# Patient Record
Sex: Female | Born: 1945 | Race: Black or African American | Hispanic: No | Marital: Married | Smoking: Former smoker
Health system: Southern US, Community
[De-identification: ages and names within clinical notes are randomized; demographics above are authoritative.]

## PROBLEM LIST (undated history)

## (undated) DIAGNOSIS — I10 Essential (primary) hypertension: Secondary | ICD-10-CM

## (undated) DIAGNOSIS — Z992 Dependence on renal dialysis: Secondary | ICD-10-CM

## (undated) DIAGNOSIS — E538 Deficiency of other specified B group vitamins: Secondary | ICD-10-CM

## (undated) DIAGNOSIS — M109 Gout, unspecified: Secondary | ICD-10-CM

## (undated) DIAGNOSIS — E119 Type 2 diabetes mellitus without complications: Secondary | ICD-10-CM

## (undated) DIAGNOSIS — N289 Disorder of kidney and ureter, unspecified: Secondary | ICD-10-CM

## (undated) DIAGNOSIS — N186 End stage renal disease: Secondary | ICD-10-CM

## (undated) DIAGNOSIS — D649 Anemia, unspecified: Secondary | ICD-10-CM

## (undated) DIAGNOSIS — M1990 Unspecified osteoarthritis, unspecified site: Secondary | ICD-10-CM

## (undated) DIAGNOSIS — N2581 Secondary hyperparathyroidism of renal origin: Secondary | ICD-10-CM

## (undated) HISTORY — PX: ABDOMINAL HYSTERECTOMY: SHX81

---

## 2004-12-15 ENCOUNTER — Ambulatory Visit: Payer: Self-pay | Admitting: Internal Medicine

## 2005-01-28 ENCOUNTER — Ambulatory Visit: Payer: Self-pay | Admitting: Family Medicine

## 2006-10-30 ENCOUNTER — Ambulatory Visit: Payer: Self-pay | Admitting: Family Medicine

## 2007-04-01 ENCOUNTER — Ambulatory Visit: Payer: Self-pay | Admitting: Internal Medicine

## 2007-08-15 ENCOUNTER — Emergency Department: Payer: Self-pay | Admitting: Emergency Medicine

## 2008-04-28 ENCOUNTER — Ambulatory Visit: Payer: Self-pay | Admitting: Family Medicine

## 2008-08-25 ENCOUNTER — Ambulatory Visit: Payer: Self-pay | Admitting: Family Medicine

## 2009-02-21 ENCOUNTER — Observation Stay: Payer: Self-pay | Admitting: Internal Medicine

## 2009-05-01 ENCOUNTER — Ambulatory Visit: Payer: Self-pay | Admitting: Family Medicine

## 2009-05-17 ENCOUNTER — Ambulatory Visit: Payer: Self-pay | Admitting: Gastroenterology

## 2010-06-05 ENCOUNTER — Ambulatory Visit: Payer: Self-pay | Admitting: Family Medicine

## 2011-06-11 ENCOUNTER — Ambulatory Visit: Payer: Self-pay | Admitting: Family Medicine

## 2011-06-14 ENCOUNTER — Ambulatory Visit: Payer: Self-pay | Admitting: Family Medicine

## 2012-06-15 ENCOUNTER — Ambulatory Visit: Payer: Self-pay | Admitting: Family Medicine

## 2013-06-16 ENCOUNTER — Ambulatory Visit: Payer: Self-pay | Admitting: Family Medicine

## 2014-06-21 ENCOUNTER — Ambulatory Visit: Payer: Self-pay | Admitting: Family Medicine

## 2015-06-06 ENCOUNTER — Other Ambulatory Visit: Payer: Self-pay | Admitting: Family Medicine

## 2015-06-06 DIAGNOSIS — Z1231 Encounter for screening mammogram for malignant neoplasm of breast: Secondary | ICD-10-CM

## 2015-06-27 ENCOUNTER — Ambulatory Visit
Admission: RE | Admit: 2015-06-27 | Discharge: 2015-06-27 | Disposition: A | Discharge status: Home / Self Care | Payer: Medicare PPO | Source: Ambulatory Visit | Attending: Family Medicine | Admitting: Family Medicine

## 2015-06-27 DIAGNOSIS — Z1231 Encounter for screening mammogram for malignant neoplasm of breast: Secondary | ICD-10-CM | POA: Diagnosis not present

## 2016-01-16 ENCOUNTER — Emergency Department
Admission: EM | Admit: 2016-01-16 | Discharge: 2016-01-16 | Disposition: A | Discharge status: Home / Self Care | Payer: Medicare PPO | Attending: Emergency Medicine | Admitting: Emergency Medicine

## 2016-01-16 ENCOUNTER — Encounter: Payer: Self-pay | Admitting: Emergency Medicine

## 2016-01-16 DIAGNOSIS — M79671 Pain in right foot: Secondary | ICD-10-CM | POA: Diagnosis present

## 2016-01-16 DIAGNOSIS — R609 Edema, unspecified: Secondary | ICD-10-CM

## 2016-01-16 DIAGNOSIS — E119 Type 2 diabetes mellitus without complications: Secondary | ICD-10-CM | POA: Insufficient documentation

## 2016-01-16 DIAGNOSIS — I10 Essential (primary) hypertension: Secondary | ICD-10-CM | POA: Diagnosis not present

## 2016-01-16 DIAGNOSIS — R809 Proteinuria, unspecified: Secondary | ICD-10-CM | POA: Insufficient documentation

## 2016-01-16 DIAGNOSIS — R600 Localized edema: Secondary | ICD-10-CM | POA: Insufficient documentation

## 2016-01-16 HISTORY — DX: Disorder of kidney and ureter, unspecified: N28.9

## 2016-01-16 HISTORY — DX: Gout, unspecified: M10.9

## 2016-01-16 HISTORY — DX: Essential (primary) hypertension: I10

## 2016-01-16 HISTORY — DX: Type 2 diabetes mellitus without complications: E11.9

## 2016-01-16 LAB — CBC WITH DIFFERENTIAL/PLATELET
Basophils Absolute: 0.1 10*3/uL (ref 0–0.1)
Basophils Relative: 1 %
EOS ABS: 0.2 10*3/uL (ref 0–0.7)
EOS PCT: 3 %
HCT: 24.1 % — ABNORMAL LOW (ref 35.0–47.0)
Hemoglobin: 8 g/dL — ABNORMAL LOW (ref 12.0–16.0)
LYMPHS ABS: 1.7 10*3/uL (ref 1.0–3.6)
Lymphocytes Relative: 18 %
MCH: 26.4 pg (ref 26.0–34.0)
MCHC: 33 g/dL (ref 32.0–36.0)
MCV: 80 fL (ref 80.0–100.0)
MONOS PCT: 8 %
Monocytes Absolute: 0.7 10*3/uL (ref 0.2–0.9)
Neutro Abs: 6.8 10*3/uL — ABNORMAL HIGH (ref 1.4–6.5)
Neutrophils Relative %: 72 %
PLATELETS: 298 10*3/uL (ref 150–440)
RBC: 3.02 MIL/uL — AB (ref 3.80–5.20)
RDW: 15.2 % — AB (ref 11.5–14.5)
WBC: 9.5 10*3/uL (ref 3.6–11.0)

## 2016-01-16 LAB — COMPREHENSIVE METABOLIC PANEL
ALT: 8 U/L — AB (ref 14–54)
AST: 13 U/L — AB (ref 15–41)
Albumin: 3.2 g/dL — ABNORMAL LOW (ref 3.5–5.0)
Alkaline Phosphatase: 67 U/L (ref 38–126)
Anion gap: 7 (ref 5–15)
BUN: 20 mg/dL (ref 6–20)
CHLORIDE: 111 mmol/L (ref 101–111)
CO2: 22 mmol/L (ref 22–32)
CREATININE: 2.45 mg/dL — AB (ref 0.44–1.00)
Calcium: 8.9 mg/dL (ref 8.9–10.3)
GFR calc Af Amer: 22 mL/min — ABNORMAL LOW (ref >60)
GFR, EST NON AFRICAN AMERICAN: 19 mL/min — AB (ref >60)
Glucose, Bld: 92 mg/dL (ref 65–99)
Potassium: 4.3 mmol/L (ref 3.5–5.1)
Sodium: 140 mmol/L (ref 135–145)
Total Bilirubin: <0.1 mg/dL — ABNORMAL LOW (ref 0.3–1.2)
Total Protein: 7.2 g/dL (ref 6.5–8.1)

## 2016-01-16 LAB — URINALYSIS COMPLETE WITH MICROSCOPIC (ARMC ONLY)
BILIRUBIN URINE: NEGATIVE
GLUCOSE, UA: NEGATIVE mg/dL
HGB URINE DIPSTICK: NEGATIVE
Ketones, ur: NEGATIVE mg/dL
Nitrite: NEGATIVE
Protein, ur: >500 mg/dL — AB
RBC / HPF: NONE SEEN RBC/hpf (ref 0–5)
Specific Gravity, Urine: 1.01 (ref 1.005–1.030)
pH: 6 (ref 5.0–8.0)

## 2016-01-16 LAB — URIC ACID: URIC ACID, SERUM: 7.9 mg/dL — AB (ref 2.3–6.6)

## 2016-01-16 MED ORDER — HYDROCHLOROTHIAZIDE 12.5 MG PO TABS: 12.5000 mg | ORAL_TABLET | Freq: Every day | ORAL | Status: AC

## 2016-01-16 MED ORDER — TRAMADOL HCL 50 MG PO TABS: 50.0000 mg | ORAL_TABLET | Freq: Two times a day (BID) | ORAL | Status: DC

## 2016-01-16 NOTE — ED Notes (Signed)
States she has a hx of gout  Having pain to right foot with swelling

## 2016-01-16 NOTE — Discharge Instructions (Signed)
Peripheral Edema You have swelling in your legs (peripheral edema). This swelling is due to excess accumulation of salt and water in your body. Edema may be a sign of heart, kidney or liver disease, or a side effect of a medication. It may also be due to problems in the leg veins. Elevating your legs and using special support stockings may be very helpful, if the cause of the swelling is due to poor venous circulation. Avoid long periods of standing, whatever the cause. Treatment of edema depends on identifying the cause. Chips, pretzels, pickles and other salty foods should be avoided. Restricting salt in your diet is almost always needed. Water pills (diuretics) are often used to remove the excess salt and water from your body via urine. These medicines prevent the kidney from reabsorbing sodium. This increases urine flow. Diuretic treatment may also result in lowering of potassium levels in your body. Potassium supplements may be needed if you have to use diuretics daily. Daily weights can help you keep track of your progress in clearing your edema. You should call your caregiver for follow up care as recommended. SEEK IMMEDIATE MEDICAL CARE IF:   You have increased swelling, pain, redness, or heat in your legs.  You develop shortness of breath, especially when lying down.  You develop chest or abdominal pain, weakness, or fainting.  You have a fever.   This information is not intended to replace advice given to you by your health care provider. Make sure you discuss any questions you have with your health care provider.   Document Released: 08/15/2004 Document Revised: 09/30/2011 Document Reviewed: 01/18/2015 Elsevier Interactive Patient Education 2016 [employer].  Proteinuria Proteinuria is a condition in which urine contains more protein than is normal. Proteinuria is either a sign that your body is producing too much protein or a sign that there is a problem with the kidneys. Healthy  kidneys prevent most substances that the body needs, including proteins, from leaving the bloodstream and ending up in urine. CAUSES  Proteinuria may be caused by a temporary event or condition such as stress, exercise, or fever, and go away on its own. Proteinuria may also be a symptom of a more serious condition or disease. Causes of proteinuria include:  A kidney disease caused by:  Diabetes.  High blood pressure (hypertension).   A disease that affects the immune system, such as lupus.  A genetic disease, such as Alport's syndrome.  Medicines that damage the kidneys, such as long-term nonsteroidal anti-inflammatory drugs (NSAIDs).  Poisoning or exposure to toxic substances.  A reoccurring kidney or urinary infection.  Excess protein production in the body caused by:  Multiple myeloma.  Amyloidosis. SYMPTOMS You may have proteinuria without having noticeable symptoms. If there is a large amount of protein in your urine, your urine may look foamy. You may also notice swelling (edema) in your hands, feet, abdomen, or face. DIAGNOSIS To determine whether you have proteinuria, you will need to provide a urine sample. Your urine will then be tested for too much protein and the main blood protein albumin. If your test shows that you have proteinuria, you may need to take additional tests to determine its cause, how much protein is in your urine, and what type of protein is being lost. Tests may include:  Blood tests.  Urine tests.  A blood pressure measurement.  Imaging tests. TREATMENT  Treatment will depend on the cause of your proteinuria. Your caregiver will discuss treatment options with you after you  have been diagnosed. If your proteinuria is mild or temporary, no treatment may be necessary. HOME CARE INSTRUCTIONS Ask your caregiver if monitoring the level of protein in your urine at home using simple testing strips is appropriate for you. Early detection of proteinuria  can lead to early and often successful treatment of the condition causing it.   This information is not intended to replace advice given to you by your health care provider. Make sure you discuss any questions you have with your health care provider.   Document Released: 08/28/2005 Document Revised: 04/01/2012 Document Reviewed: 12/06/2011 Elsevier Interactive Patient Education 2016 [place] should follow-up with your provider for ongoing treatment of your fluid retention and protein in the urine. Take the prescription meds as directed. Rest with the legs elevated when seated.

## 2016-01-17 NOTE — ED Provider Notes (Signed)
[facility] Emergency Department Provider Note ____________________________________________  Time seen: 65  I have reviewed the triage vital signs and the nursing notes.  HISTORY  Chief Complaint  Foot Pain  HPI Shelby Galloway is a 70 y.o. female presents to the ED accompanied by her adult son, for evaluation of foot pain on the rightand swelling to the legs. The patient reports she was recently evaluated by her primary care provider and treated with about a week's worth of prednisone which she dosed twice daily. The treatment was for an assumed gout flare causing pain to the right foot and leg. She has since completed the prednisone and reported resolution of her pain. Over the last few days however she's noted increased swelling to the right and left lower extremity as well as return of the pain to her right great toe and ankle. She denies any interim injury, fall, or trauma. She has been limited in her ability to ambulate recently due to the swelling, and has had to use her cane to get around. She was unable to sit On with the primary care provider or get a call back today, so she presents here for further evaluation and management. She gives a history of diabetes previously treated with metformin, which was recently discontinued by her primary provider. She has not had a gout attack from the time she began dosing metformin. She has a history of anemia, high blood pressure and gout.  Past Medical History  Diagnosis Date  . Gout   . Hypertension   . Diabetes mellitus without complication ([place])   . Renal disorder     There are no active problems to display for this patient.   Past Surgical History  Procedure Laterality Date  . Abdominal hysterectomy      Current Outpatient Rx  Name  Route  Sig  Dispense  Refill  . hydrochlorothiazide (HYDRODIURIL) 12.5 MG tablet   Oral   Take 1 tablet (12.5 mg total) by mouth daily.   10 tablet   0   . traMADol  (ULTRAM) 50 MG tablet   Oral   Take 1 tablet (50 mg total) by mouth 2 (two) times daily.   10 tablet   0    Allergies Codeine  No family history on file.  Social History Social History  Substance Use Topics  . Smoking status: Never Smoker   . Smokeless tobacco: None  . Alcohol Use: No   Review of Systems  Constitutional: Negative for fever. Cardiovascular: Negative for chest pain. Swelling to the legs as above.  Respiratory: Negative for shortness of breath. Gastrointestinal: Negative for abdominal pain, vomiting and diarrhea. Genitourinary: Negative for dysuria, retention, or incontinence. Report urinary frequency Musculoskeletal: Negative for back pain. Right foot pain at the great toe.  Skin: Negative for rash. Neurological: Negative for headaches, focal weakness or numbness. ____________________________________________  PHYSICAL EXAM:  VITAL SIGNS: ED Triage Vitals  Enc Vitals Group     BP 01/16/16 1833 160/69 mmHg     Pulse Rate 01/16/16 1833 78     Resp 01/16/16 1833 18     Temp 01/16/16 1833 100.3 F (37.9 C)     Temp Source 01/16/16 1833 Oral     SpO2 01/16/16 1833 100 %     Weight 01/16/16 1833 169 lb (76.658 kg)     Height 01/16/16 1833 5\' 6"  (1.676 m)     Head Cir --      Peak Flow --  Pain Score 01/16/16 1838 4     Pain Loc --      Pain Edu? --      Excl. in [place]? --    Constitutional: Alert and oriented. Well appearing and in no distress. Head: Normocephalic and atraumatic. Cardiovascular: Normal rate, regular rhythm. Normal distal pulses and cap refill.  Respiratory: Normal respiratory effort. No wheezes/rales/rhonchi. Gastrointestinal: Soft and nontender. No distention. Musculoskeletal: Right great toe with local erythema and tenderness to the MTP. Ankle edema noted with normal ROM. No calf or achilles tenderness noted. Nontender with normal range of motion in all extremities.  Neurologic:  Normal gait without ataxia. Normal speech and  language. No gross focal neurologic deficits are appreciated. Skin:  Skin is warm, dry and intact. No rash noted. 2+ pitting edema bilaterally LE from the feet to the proximal shins. No skin weeping, erythema, or induration.  Psychiatric: Mood and affect are normal. Patient exhibits appropriate insight and judgment. ____________________________________________   LABS (pertinent positives/negatives) Labs Reviewed  URINALYSIS COMPLETEWITH MICROSCOPIC ([facility] ONLY) - Abnormal; Notable for the following:    Color, Urine YELLOW (*)    APPearance HAZY (*)    Protein, ur >500 (*)    Leukocytes, UA TRACE (*)    Bacteria, UA MANY (*)    Squamous Epithelial / LPF 0-5 (*)    All other components within normal limits  COMPREHENSIVE METABOLIC PANEL - Abnormal; Notable for the following:    Creatinine, Ser 2.45 (*)    Albumin 3.2 (*)    AST 13 (*)    ALT 8 (*)    Total Bilirubin <0.1 (*)    GFR calc non Af Amer 19 (*)    GFR calc Af Amer 22 (*)    All other components within normal limits  CBC WITH DIFFERENTIAL/PLATELET - Abnormal; Notable for the following:    RBC 3.02 (*)    Hemoglobin 8.0 (*)    HCT 24.1 (*)    RDW 15.2 (*)    Neutro Abs 6.8 (*)    All other components within normal limits  URIC ACID - Abnormal; Notable for the following:    Uric Acid, Serum 7.9 (*)    All other components within normal limits  ____________________________________________  INITIAL IMPRESSION / ASSESSMENT AND PLAN / ED COURSE  Patient with peripheral edema and proteinuria on exam. She also has an elevated uric acid consistent with her history of gout. She is advised to follow with the primary care provider ahead of her referral to nephrology. She is to rest the legs elevated and take the prescribed Ultram as needed for pain. She is also given low-dose HCTZ to help resolve some of her lower extremity edema. She is to return to the ED for any worsening symptoms as  discussed. ____________________________________________  FINAL CLINICAL IMPRESSION(S) / ED DIAGNOSES  Final diagnoses:  Peripheral edema  Proteinuria     Melvenia Needles, PA-C 01/17/16 0031  Lisa Roca, MD 01/17/16 1610

## 2016-06-04 ENCOUNTER — Other Ambulatory Visit: Payer: Self-pay | Admitting: Family Medicine

## 2016-06-04 DIAGNOSIS — Z1231 Encounter for screening mammogram for malignant neoplasm of breast: Secondary | ICD-10-CM

## 2016-06-27 ENCOUNTER — Ambulatory Visit
Admission: RE | Admit: 2016-06-27 | Discharge: 2016-06-27 | Disposition: A | Discharge status: Home / Self Care | Payer: Medicare PPO | Source: Ambulatory Visit | Attending: Family Medicine | Admitting: Family Medicine

## 2016-06-27 DIAGNOSIS — Z1231 Encounter for screening mammogram for malignant neoplasm of breast: Secondary | ICD-10-CM | POA: Insufficient documentation

## 2016-10-30 ENCOUNTER — Other Ambulatory Visit: Payer: Self-pay | Admitting: Family Medicine

## 2016-10-30 DIAGNOSIS — N6322 Unspecified lump in the left breast, upper inner quadrant: Secondary | ICD-10-CM

## 2016-11-27 ENCOUNTER — Ambulatory Visit
Admission: RE | Admit: 2016-11-27 | Discharge: 2016-11-27 | Disposition: A | Discharge status: Home / Self Care | Payer: Medicare PPO | Source: Ambulatory Visit | Attending: Family Medicine | Admitting: Family Medicine

## 2016-11-27 DIAGNOSIS — N6322 Unspecified lump in the left breast, upper inner quadrant: Secondary | ICD-10-CM | POA: Diagnosis not present

## 2017-04-15 DIAGNOSIS — E1122 Type 2 diabetes mellitus with diabetic chronic kidney disease: Secondary | ICD-10-CM | POA: Insufficient documentation

## 2017-04-15 DIAGNOSIS — M1711 Unilateral primary osteoarthritis, right knee: Secondary | ICD-10-CM | POA: Insufficient documentation

## 2017-06-24 ENCOUNTER — Other Ambulatory Visit: Payer: Self-pay | Admitting: Family Medicine

## 2017-06-24 DIAGNOSIS — R928 Other abnormal and inconclusive findings on diagnostic imaging of breast: Secondary | ICD-10-CM

## 2017-07-01 ENCOUNTER — Other Ambulatory Visit: Payer: Self-pay | Admitting: Family Medicine

## 2017-07-01 DIAGNOSIS — R928 Other abnormal and inconclusive findings on diagnostic imaging of breast: Secondary | ICD-10-CM

## 2017-07-24 DIAGNOSIS — D638 Anemia in other chronic diseases classified elsewhere: Secondary | ICD-10-CM | POA: Insufficient documentation

## 2017-07-24 DIAGNOSIS — R002 Palpitations: Secondary | ICD-10-CM | POA: Insufficient documentation

## 2017-07-24 DIAGNOSIS — N184 Chronic kidney disease, stage 4 (severe): Secondary | ICD-10-CM | POA: Insufficient documentation

## 2017-08-19 ENCOUNTER — Ambulatory Visit
Admission: RE | Admit: 2017-08-19 | Discharge: 2017-08-19 | Disposition: A | Discharge status: Home / Self Care | Payer: Medicare PPO | Source: Ambulatory Visit | Attending: Family Medicine | Admitting: Family Medicine

## 2017-08-19 DIAGNOSIS — R928 Other abnormal and inconclusive findings on diagnostic imaging of breast: Secondary | ICD-10-CM | POA: Diagnosis present

## 2017-10-28 ENCOUNTER — Other Ambulatory Visit: Payer: Self-pay | Admitting: Nephrology

## 2017-10-28 DIAGNOSIS — N184 Chronic kidney disease, stage 4 (severe): Secondary | ICD-10-CM

## 2017-11-10 ENCOUNTER — Ambulatory Visit
Admission: RE | Admit: 2017-11-10 | Discharge: 2017-11-10 | Disposition: A | Discharge status: Home / Self Care | Payer: Medicare PPO | Source: Ambulatory Visit | Attending: Nephrology | Admitting: Nephrology

## 2017-11-10 DIAGNOSIS — N184 Chronic kidney disease, stage 4 (severe): Secondary | ICD-10-CM | POA: Insufficient documentation

## 2017-11-10 DIAGNOSIS — N281 Cyst of kidney, acquired: Secondary | ICD-10-CM | POA: Insufficient documentation

## 2017-11-10 DIAGNOSIS — N261 Atrophy of kidney (terminal): Secondary | ICD-10-CM | POA: Insufficient documentation

## 2018-02-23 ENCOUNTER — Other Ambulatory Visit (INDEPENDENT_AMBULATORY_CARE_PROVIDER_SITE_OTHER): Payer: Self-pay | Admitting: Vascular Surgery

## 2018-02-23 DIAGNOSIS — N186 End stage renal disease: Secondary | ICD-10-CM

## 2018-02-24 ENCOUNTER — Encounter (INDEPENDENT_AMBULATORY_CARE_PROVIDER_SITE_OTHER): Payer: Self-pay | Admitting: Vascular Surgery

## 2018-02-24 ENCOUNTER — Encounter (INDEPENDENT_AMBULATORY_CARE_PROVIDER_SITE_OTHER): Payer: Self-pay

## 2018-02-24 ENCOUNTER — Ambulatory Visit (INDEPENDENT_AMBULATORY_CARE_PROVIDER_SITE_OTHER): Payer: Medicare PPO | Admitting: Vascular Surgery

## 2018-02-24 ENCOUNTER — Ambulatory Visit (INDEPENDENT_AMBULATORY_CARE_PROVIDER_SITE_OTHER): Payer: Medicare PPO

## 2018-02-24 VITALS — BP 147/69 | HR 55 | Resp 13 | Ht 65.0 in | Wt 164.0 lb

## 2018-02-24 DIAGNOSIS — N186 End stage renal disease: Secondary | ICD-10-CM | POA: Diagnosis not present

## 2018-02-24 DIAGNOSIS — E119 Type 2 diabetes mellitus without complications: Secondary | ICD-10-CM

## 2018-02-24 DIAGNOSIS — N184 Chronic kidney disease, stage 4 (severe): Secondary | ICD-10-CM

## 2018-02-24 DIAGNOSIS — I10 Essential (primary) hypertension: Secondary | ICD-10-CM | POA: Diagnosis not present

## 2018-02-24 NOTE — Patient Instructions (Signed)
Vascular Access for Hemodialysis A vascular access is a connection between two blood vessels that allows blood to be easily removed from the body and returned to the body during hemodialysis. Hemodialysis is a procedure in which a machine outside of the body filters the blood. There are three types of vascular accesses:  Arteriovenous fistula. This is a connection between an artery and a vein (usually in the arm) that is made by sewing them together. Blood in the artery flows directly into the vein, causing it to get larger over time. This makes it easier for the vein to be used for hemodialysis. An arteriovenous fistula takes 1-6 months to develop after surgery.  Arteriovenous graft. This is a connection between an artery and a vein in the arm that is made with a tube. An arteriovenous graft can be used within 2-3 weeks of surgery.  Venous catheter. This is a thin, flexible tube that is placed in a large vein (usually in the neck, chest, or groin). A venous catheter for hemodialysis contains two tubes that come out of the skin. A venous catheter can be used right away. It is usually used as a temporary access if you need hemodialysis before a fistula or graft has developed. It may also be used as a permanent access if a fistula or graft cannot be created.  Which type of access is best for me? The type of access that is best for you depends on the size and strength of your veins. A fistula is usually the preferred type of access. It can last several years and is less likely than the other types of accesses to become infected or to cause blood clots within a blood vessel (thrombosis). However, a fistula is not an option for everyone. If your veins are not the right size, a graft may be used instead. Grafts require you to have strong veins. If your veins are not strong enough for a graft, a catheter may be used. Catheters are more likely than fistulas and grafts to become infected or to have  thrombosis. Sometimes, only one type of access is an option. Your health care provider will help you determine which type of access is best for you. How is a vascular access used? The way the access is used depends on the type of access:  If the access is a fistula or graft, two needles are inserted through the skin into the access before each hemodialysis session. Blood leaves the body through one of the needles and travels through a tube to the hemodialysis machine (dialyzer). It then flows through another tube and returns to the body through the second needle.  If the access is a catheter, one tube is connected directly to the tube that leads to the dialyzer and the other is connected to a tube that leads away from the dialyzer. Blood leaves the body through one tube and returns to the body through the other.  What kind of problems can occur with vascular accesses?  Blood clots within a blood vessel (thrombosis). Thrombosis can lead to a narrowing of a blood vessel or tube (stenosis). If thrombosis occurs frequently, another access site may be created as a backup.  Infection. These problems are most likely to occur with a venous catheter and least likely to occur with an arteriovenous fistula. How do I care for my vascular access? Wear a medical alert bracelet. This tells health care providers that you are a dialysis patient in the case of an emergency and   allows them to care for your veins appropriately. If you have a graft or fistula:  A "bruit" is a noise that is heard with a stethoscope and a "thrill" is a vibration felt over the graft or fistula. The presence of the bruit and thrill indicates that the access is working. You will be taught to feel for the thrill each day. If this is not felt, the access may be clotted. Call your health care provider.  You may use the arm where your vascular access is located freely after the site heals. Keep the following in mind: ? Avoid pressure on the  arm. ? Avoid lifting heavy objects with the arm. ? Avoid sleeping on the arm. ? Avoid wearing tight-sleeved shirts or jewelry around the graft or fistula.  Do not allow blood pressure monitoring or needle punctures on the side where the graft or fistula is located.  With permission from your health care provider, you may do exercises to help with blood flow through a fistula. These exercises involve squeezing a rubber ball or other soft objects as instructed.  Contact a health care provider if:  Chills develop.  You have an oral temperature above 102 F (38.9 C).  Swelling around the graft or fistula gets worse.  New pain develops.  Pus or other fluid (drainage) is seen at the vascular access site.  Skin redness or red streaking is seen on the skin around, above, or below the vascular access. Get help right away if:  Pain, numbness, or an unusual pale skin color develops in the hand on the side of your fistula.  Dizziness or weakness develops that you have not had before.  The vascular access has bleeding that cannot be easily controlled. This information is not intended to replace advice given to you by your health care provider. Make sure you discuss any questions you have with your health care provider. Document Released: 09/28/2002 Document Revised: 12/14/2015 Document Reviewed: 11/24/2012 Elsevier Interactive Patient Education  2017 Elsevier Inc[employer]  

## 2018-02-24 NOTE — Progress Notes (Signed)
Patient ID: Lee-Anne Flicker, female   DOB: 11/08/1945, 72 y.o.   MRN: [id number]  Chief Complaint  Patient presents with  . New Patient (Initial Visit)    Vein Mapping    HPI Madyn Ivins is a 72 y.o. female.  I am asked to see the patient by DR. Chang for evaluation of dialysis access.  The patient reports no current uremic symptoms that would prompt immediate dialysis.  Her renal insufficiency is likely secondary to hypertension and diabetes.  No significant volume overload, poor appetite, profound itching, or mental status changes.  She has a long history of chronic kidney disease but does not really know how close she is to dialysis.  She is right-hand dominant.  No fevers or chills.  Vein mapping studies were performed today which showed triphasic arterial waveforms throughout both upper extremities.  However, there were no adequate superficial vessels in either upper extremity for fistula creation with both the cephalic and basilic veins being below 2 mm throughout their course.   Past Medical History:  Diagnosis Date  . Diabetes mellitus without complication ([place])   . Gout   . Hypertension   . Renal disorder     Past Surgical History:  Procedure Laterality Date  . ABDOMINAL HYSTERECTOMY      Family History  Problem Relation Age of Onset  . Breast cancer Neg Hx   No bleeding disorders, clotting disorders, autoimmune diseases, or aneurysms  Social History Social History   Tobacco Use  . Smoking status: Never Smoker  . Smokeless tobacco: Never Used  Substance Use Topics  . Alcohol use: No  . Drug use: Not on file    Allergies  Allergen Reactions  . Ace Inhibitors   . Atorvastatin   . Chlorthalidone   . Codeine Other (See Comments)    States she feels "funny " in the head  . Losartan     States worsened gout    Current Outpatient Medications  Medication Sig Dispense Refill  . allopurinol (ZYLOPRIM) 100 MG tablet Take by mouth.    Marland Kitchen amLODipine  (NORVASC) 10 MG tablet Take by mouth.    . Cholecalciferol (VITAMIN D3) 2000 units capsule Take by mouth.    . ferrous sulfate 325 (65 FE) MG tablet Take by mouth.    . fluticasone (FLONASE) 50 MCG/ACT nasal spray 2 sprays by Each Nare route daily.    . furosemide (LASIX) 20 MG tablet     . glipiZIDE (GLUCOTROL XL) 10 MG 24 hr tablet Take by mouth.    . hydrALAZINE (APRESOLINE) 25 MG tablet Take by mouth.    . hydrochlorothiazide (HYDRODIURIL) 12.5 MG tablet Take 1 tablet (12.5 mg total) by mouth daily. 10 tablet 0  . isosorbide mononitrate (IMDUR) 60 MG 24 hr tablet Take by mouth.    . metoprolol succinate (TOPROL-XL) 25 MG 24 hr tablet Take by mouth.    . rosuvastatin (CRESTOR) 20 MG tablet Take by mouth.    . sodium bicarbonate 650 MG tablet Take 650 mg by mouth 2 (two) times daily.    . traMADol (ULTRAM) 50 MG tablet Take 1 tablet (50 mg total) by mouth 2 (two) times daily. (Patient not taking: Reported on 02/24/2018) 10 tablet 0   No current facility-administered medications for this visit.       REVIEW OF SYSTEMS (Negative unless checked)  Constitutional: [] Weight loss  [] Fever  [] Chills Cardiac: [] Chest pain   [] Chest pressure   [] Palpitations   [] Shortness  of breath when laying flat   [] Shortness of breath at rest   [] Shortness of breath with exertion. Vascular:  [] Pain in legs with walking   [] Pain in legs at rest   [] Pain in legs when laying flat   [] Claudication   [] Pain in feet when walking  [] Pain in feet at rest  [] Pain in feet when laying flat   [] History of DVT   [] Phlebitis   [] Swelling in legs   [] Varicose veins   [] Non-healing ulcers Pulmonary:   [] Uses home oxygen   [] Productive cough   [] Hemoptysis   [] Wheeze  [] COPD   [] Asthma Neurologic:  [] Dizziness  [] Blackouts   [] Seizures   [] History of stroke   [] History of TIA  [] Aphasia   [] Temporary blindness   [] Dysphagia   [] Weakness or numbness in arms   [] Weakness or numbness in legs Musculoskeletal:  [x] Arthritis   [] Joint  swelling   [] Joint pain   [] Low back pain Hematologic:  [] Easy bruising  [] Easy bleeding   [] Hypercoagulable state   [x] Anemic  [] Hepatitis Gastrointestinal:  [] Blood in stool   [] Vomiting blood  [] Gastroesophageal reflux/heartburn   [] Abdominal pain Genitourinary:  [x] Chronic kidney disease   [] Difficult urination  [] Frequent urination  [] Burning with urination   [] Hematuria Skin:  [] Rashes   [] Ulcers   [] Wounds Psychological:  [] History of anxiety   []  History of major depression.    Physical Exam BP (!) 147/69 (BP Location: Right Arm, Patient Position: Sitting)   Pulse (!) 55   Resp 13   Ht 5\' 5"  (1.651 m)   Wt 164 lb (74.4 kg)   BMI 27.29 kg/m  Gen:  WD/WN, NAD Head: [place]/AT, No temporalis wasting.  Ear/Nose/Throat: Hearing grossly intact, nares w/o erythema or drainage, oropharynx w/o Erythema/Exudate Eyes: Conjunctiva clear, sclera non-icteric  Neck: trachea midline.   Pulmonary:  Good air movement, respirations not labored, no use of accessory muscles Cardiac: RRR, no JVD Vascular:  Vessel Right Left  Radial Palpable Palpable                                   Gastrointestinal: soft, non-tender/non-distended. Musculoskeletal: M/S 5/5 throughout.  Extremities without ischemic changes.  No deformity or atrophy.  Trace lower extremity edema. Neurologic: Sensation grossly intact in extremities.  Symmetrical.  Speech is fluent. Motor exam as listed above. Psychiatric: Judgment intact, Mood & affect appropriate for pt's clinical situation. Dermatologic: No rashes or ulcers noted.  No cellulitis or open wounds.    Radiology No results found.  Labs No results found for this or any previous visit (from the past 2160 hour(s)).  Assessment/Plan:  Hypertension An underlying cause of her renal failure and blood pressure control important in reducing the progression of atherosclerotic disease. On appropriate oral medications.   Diabetes mellitus without complication  ([place]) An underlying cause of her renal failure and blood glucose control important in reducing the progression of atherosclerotic disease. Also, involved in wound healing. On appropriate medications.   Chronic kidney disease (CKD), stage IV (severe) ([facility]) Vein mapping studies were performed today which showed triphasic arterial waveforms throughout both upper extremities.  However, there were no adequate superficial vessels in either upper extremity for fistula creation with both the cephalic and basilic veins being below 2 mm throughout their course. We have discussed the differences between fistulas and grafts.  We have also discussed why we placed fistulous well ahead of time but generally reserved graft  placement for immediately before dialysis.  I would recommend a left arm AV graft be placed about 1 month before she initiates dialysis.  I will send a note to her nephrologist to try to coordinate that timing.      Leotis Pain 02/24/2018, 9:35 AM   This note was created with Dragon medical transcription system.  Any errors from dictation are unintentional.

## 2018-02-24 NOTE — Assessment & Plan Note (Signed)
An underlying cause of her renal failure and blood glucose control important in reducing the progression of atherosclerotic disease. Also, involved in wound healing. On appropriate medications.  

## 2018-02-24 NOTE — Assessment & Plan Note (Signed)
Vein mapping studies were performed today which showed triphasic arterial waveforms throughout both upper extremities.  However, there were no adequate superficial vessels in either upper extremity for fistula creation with both the cephalic and basilic veins being below 2 mm throughout their course. We have discussed the differences between fistulas and grafts.  We have also discussed why we placed fistulous well ahead of time but generally reserved graft placement for immediately before dialysis.  I would recommend a left arm AV graft be placed about 1 month before she initiates dialysis.  I will send a note to her nephrologist to try to coordinate that timing.

## 2018-02-24 NOTE — Assessment & Plan Note (Signed)
An underlying cause of her renal failure and blood pressure control important in reducing the progression of atherosclerotic disease. On appropriate oral medications.  

## 2018-07-22 IMAGING — US US RENAL
1 series · 14 of 25 positions shown · non-contrast
Comparison: 08/15/2007 unenhanced CT.

CLINICAL DATA: 71-year-old female chronic kidney disease stage 4.
Initial encounter.

EXAM:
RENAL / URINARY TRACT ULTRASOUND COMPLETE

[Series 1: us renal · 0.22mm/px · 70 acquisitions, 14 frames shown]
[im 1/70]
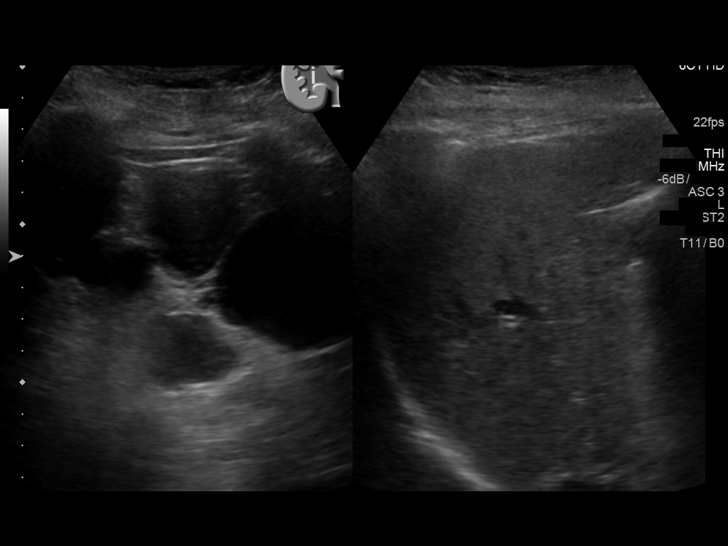
[im 6/70]
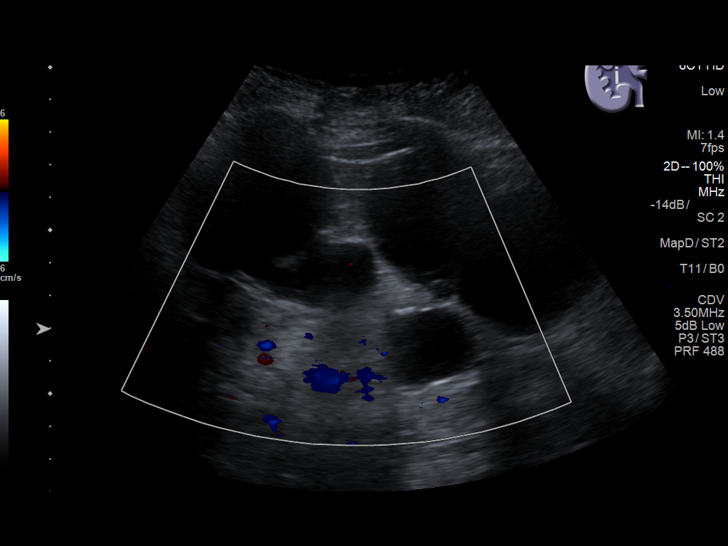
[im 12/70]
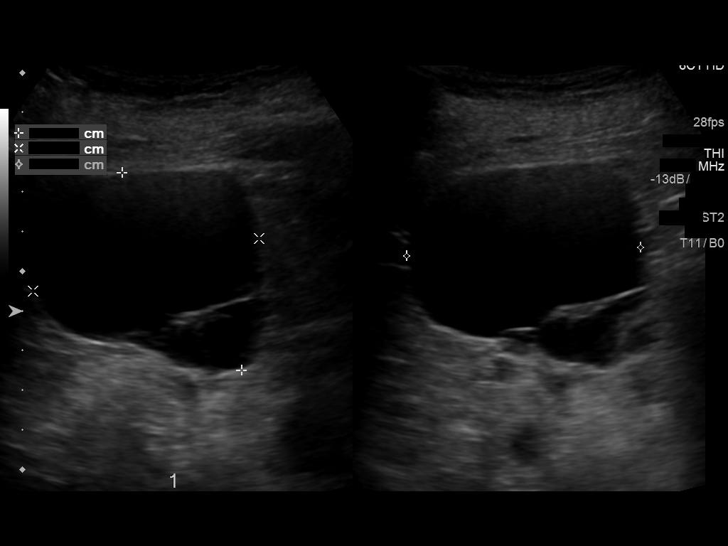
[im 18/70]
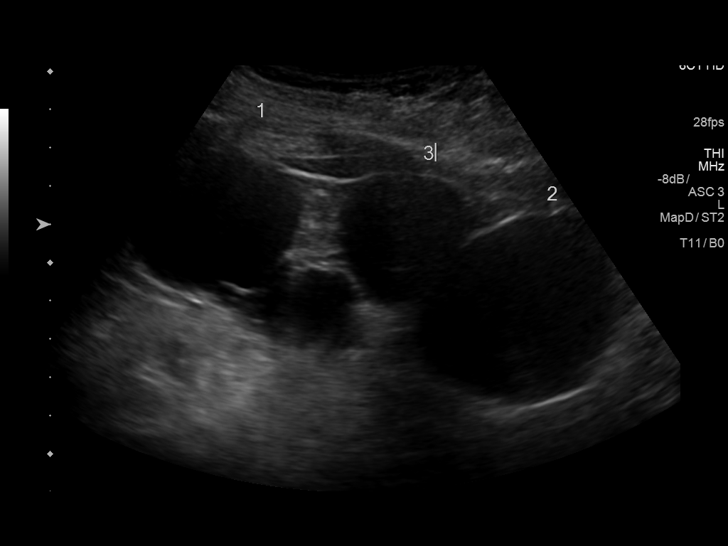
[im 24/70]
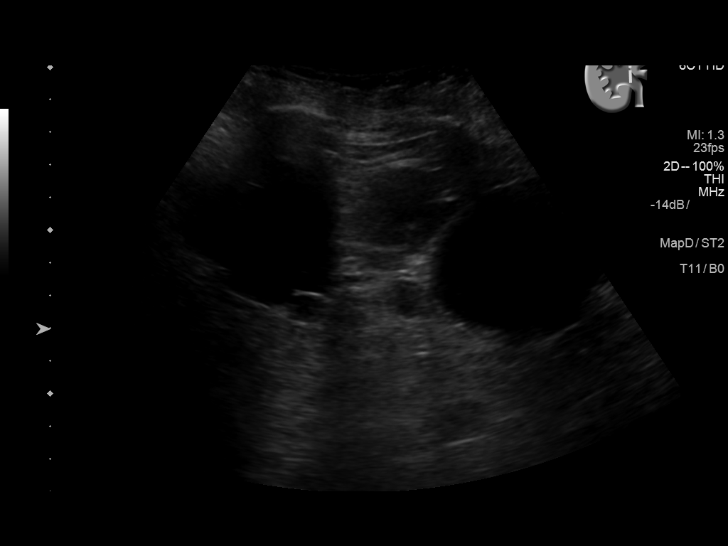
[im 26/70]
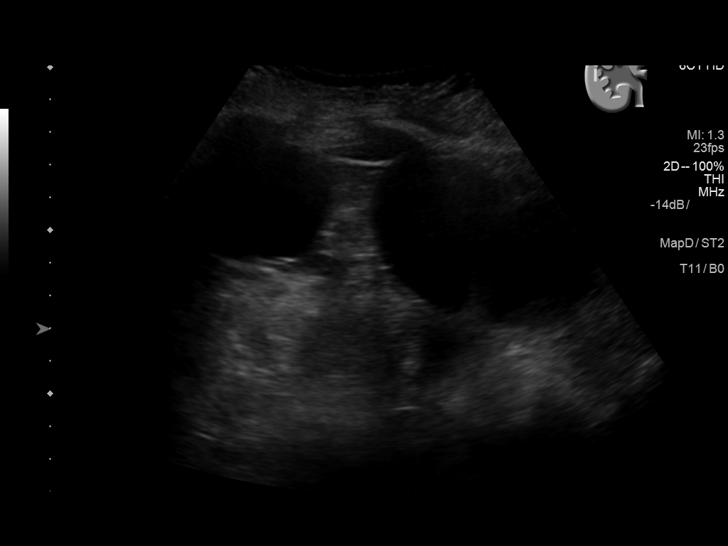
[im 32/70]
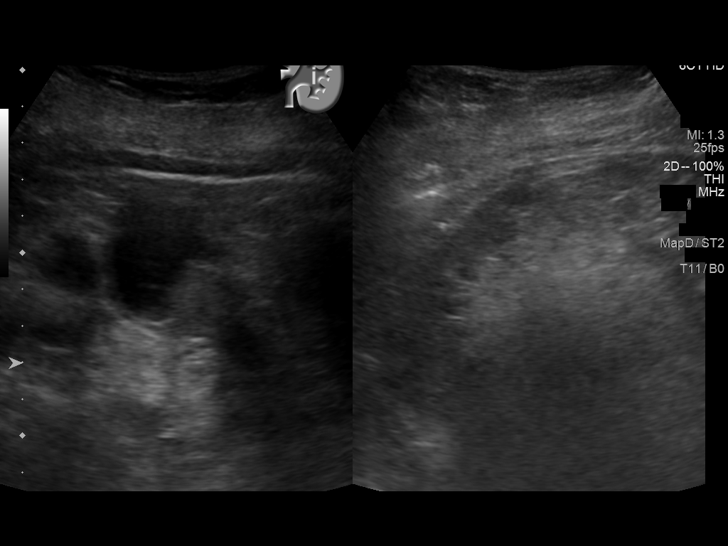
[im 38/70]
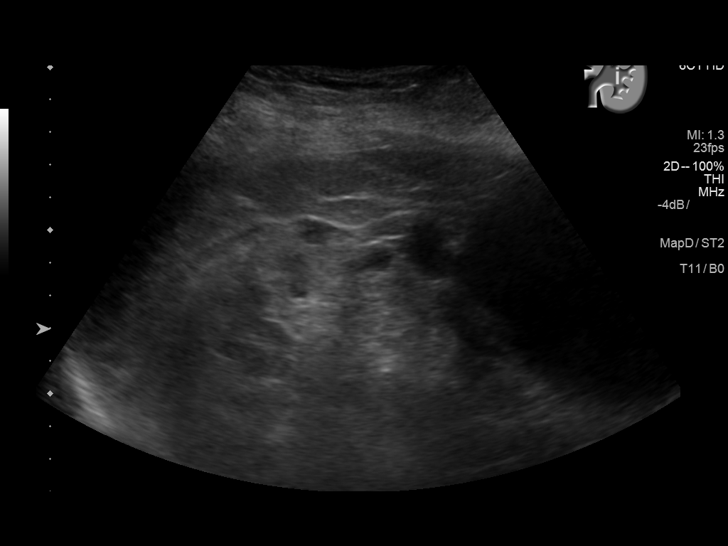
[im 44/70]
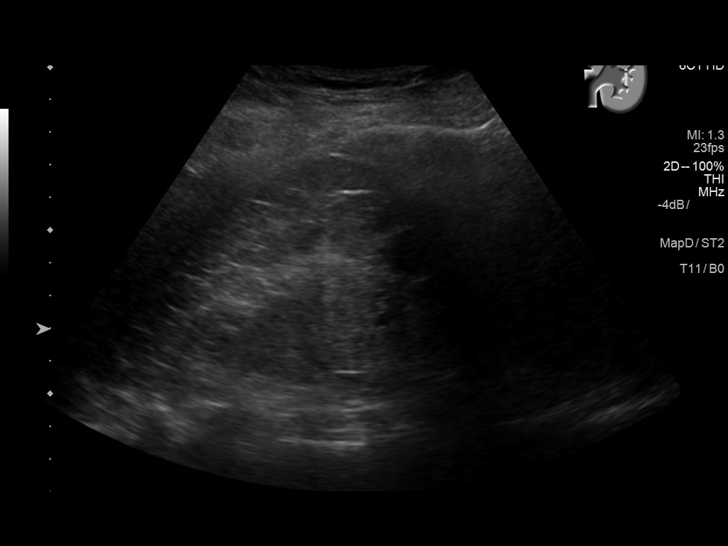
[im 47/70]
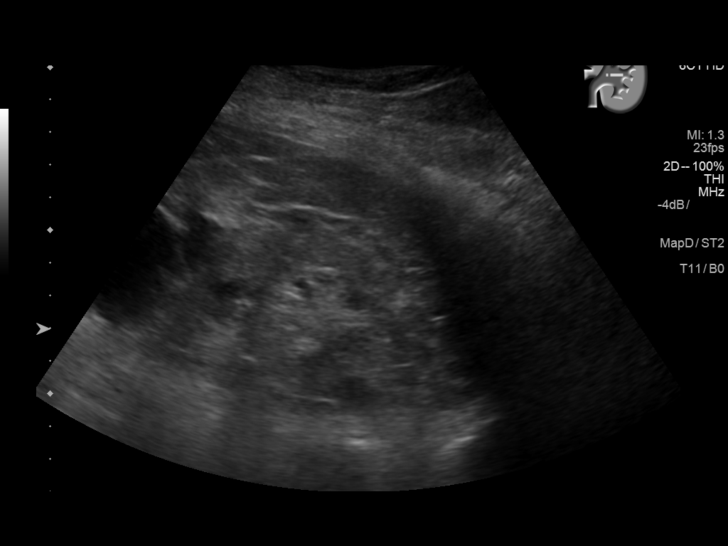
[im 52/70]
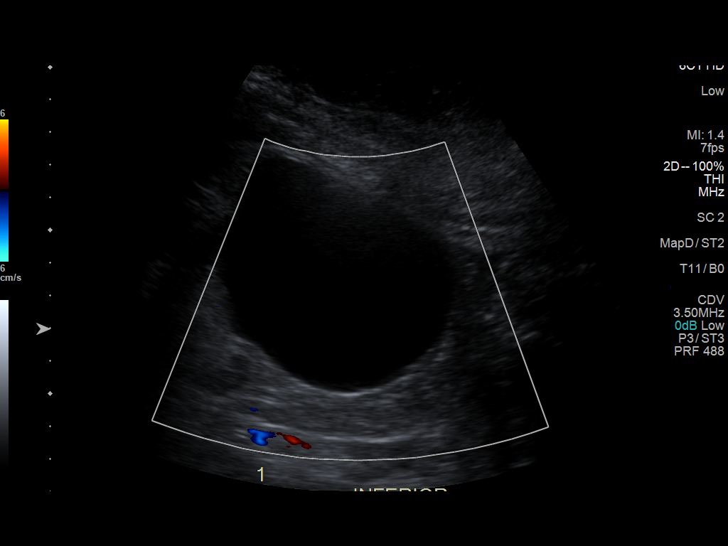
[im 58/70]
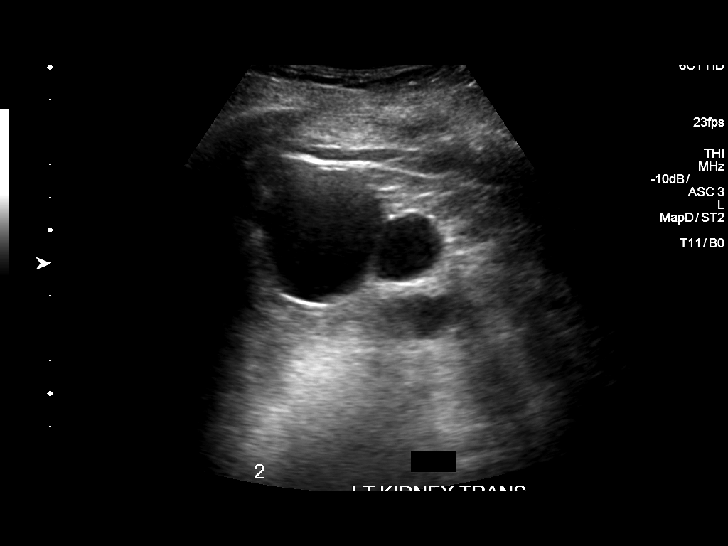
[im 64/70]
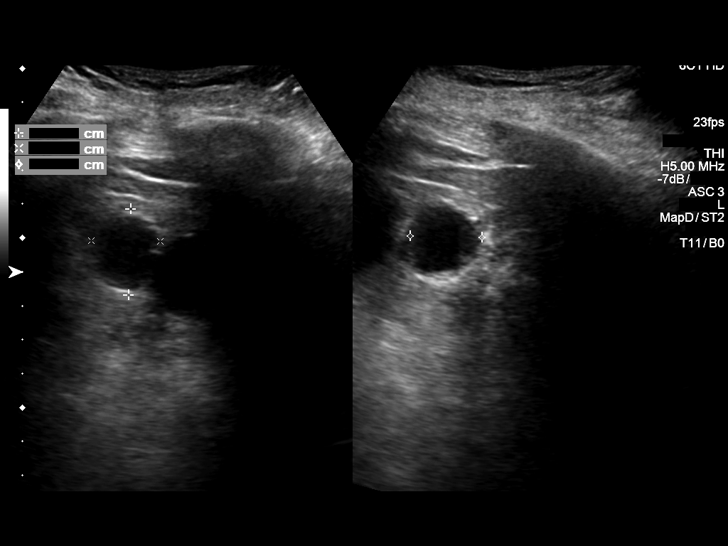
[im 70/70]
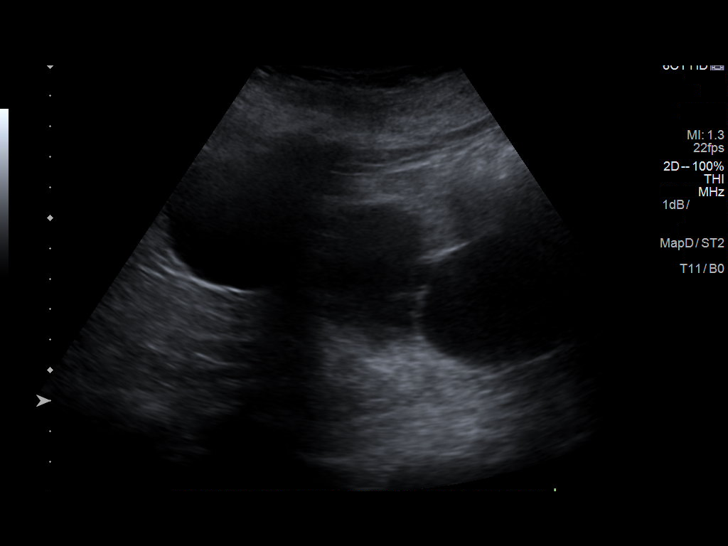

[14 of 25 positions shown; findings below may reference images not displayed]

FINDINGS: Right Kidney:

Length: 6.2 cm. Atrophic kidney with increased echogenicity. No
hydronephrosis. Multiple right-sided renal cysts. Majority are
simple appearing however, upper pole 5.9 cm cyst may have thick
septations.

Left Kidney:

Length: 8.4 cm.. Increased renal parenchyma echogenicity. No
hydronephrosis. Multiple left-sided renal cysts. Largest measures up
to 8.3 cm.

Bladder:

Appears normal for degree of bladder distention.
IMPRESSION: Increased renal parenchyma echogenicity bilaterally with kidneys
appearing atrophic (more notable on the right). Findings consistent
with medical renal disease/renal failure.

No hydronephrosis.

Multiple bilateral renal cysts. Majority appear simple however,
right upper pole 5.9 cm cyst may have thick septations and therefore
an indeterminate lesion. As patient may not be able to have
contrast, cysts could be further evaluated with noncontrast MR if
patient is able.

## 2019-07-06 ENCOUNTER — Encounter (INDEPENDENT_AMBULATORY_CARE_PROVIDER_SITE_OTHER): Payer: Self-pay

## 2019-07-06 ENCOUNTER — Other Ambulatory Visit: Payer: Self-pay

## 2019-07-06 ENCOUNTER — Ambulatory Visit (INDEPENDENT_AMBULATORY_CARE_PROVIDER_SITE_OTHER): Payer: Medicare PPO | Admitting: Vascular Surgery

## 2019-07-06 ENCOUNTER — Encounter (INDEPENDENT_AMBULATORY_CARE_PROVIDER_SITE_OTHER): Payer: Self-pay | Admitting: Vascular Surgery

## 2019-07-06 VITALS — BP 113/58 | HR 67 | Resp 18 | Ht 65.0 in | Wt 137.0 lb

## 2019-07-06 DIAGNOSIS — N185 Chronic kidney disease, stage 5: Secondary | ICD-10-CM

## 2019-07-06 DIAGNOSIS — E119 Type 2 diabetes mellitus without complications: Secondary | ICD-10-CM

## 2019-07-06 DIAGNOSIS — I10 Essential (primary) hypertension: Secondary | ICD-10-CM | POA: Diagnosis not present

## 2019-07-06 NOTE — Progress Notes (Signed)
Patient ID: Shelby Galloway, female   DOB: June 19, 1946, 73 y.o.   MRN: [id number]  Chief Complaint  Patient presents with  . Follow-up    discuss access placement    HPI Shelby Galloway is a 73 y.o. female.  I am asked to see the patient by Dr. Radene Knee for evaluation of dialysis access.  She was originally seen last year with stage IV chronic kidney disease and an evaluation for fistula creation.  She did not have any adequate superficial vein for fistula creation, so access placement was deferred at that time.  She has had slow progression of her renal failure and she is now nearing dialysis dependence.  I do not have her most recent creatinine clearance in front of me, but she understands that it has gone down.  She says she feels tired and does not have a lot of energy.  She does not have a lot of extra fluid or volume overload at this time.  Her appetite is fair.  She is right-hand dominant.     Past Medical History:  Diagnosis Date  . Diabetes mellitus without complication ([place])   . Gout   . Hypertension   . Renal disorder     Past Surgical History:  Procedure Laterality Date  . ABDOMINAL HYSTERECTOMY       Family History  Problem Relation Age of Onset  . Breast cancer Neg Hx   no bleeding disorders, clotting disorders, or aneurysms   Social History   Tobacco Use  . Smoking status: Never Smoker  . Smokeless tobacco: Never Used  Substance Use Topics  . Alcohol use: No  . Drug use: Not on file     Allergies  Allergen Reactions  . Ace Inhibitors   . Atorvastatin   . Chlorthalidone   . Codeine Other (See Comments)    States she feels "funny " in the head  . Losartan     States worsened gout    Current Outpatient Medications  Medication Sig Dispense Refill  . allopurinol (ZYLOPRIM) 100 MG tablet Take by mouth.    Marland Kitchen amLODipine (NORVASC) 10 MG tablet Take by mouth.    . Cholecalciferol (VITAMIN D3) 2000 units capsule Take by mouth.    . ferrous  sulfate 325 (65 FE) MG tablet Take by mouth.    . fluticasone (FLONASE) 50 MCG/ACT nasal spray 2 sprays by Each Nare route daily.    . furosemide (LASIX) 20 MG tablet     . hydrALAZINE (APRESOLINE) 25 MG tablet Take by mouth.    . hydrochlorothiazide (HYDRODIURIL) 12.5 MG tablet Take 1 tablet (12.5 mg total) by mouth daily. 10 tablet 0  . metoprolol succinate (TOPROL-XL) 25 MG 24 hr tablet Take by mouth.    . patiromer (VELTASSA) 8.4 g packet Take by mouth.    . pravastatin (PRAVACHOL) 20 MG tablet Take by mouth.    . rosuvastatin (CRESTOR) 20 MG tablet Take by mouth.    . sodium bicarbonate 650 MG tablet Take 650 mg by mouth 2 (two) times daily.    Marland Kitchen glipiZIDE (GLUCOTROL XL) 10 MG 24 hr tablet Take by mouth.    . isosorbide mononitrate (IMDUR) 60 MG 24 hr tablet Take by mouth.    . traMADol (ULTRAM) 50 MG tablet Take 1 tablet (50 mg total) by mouth 2 (two) times daily. (Patient not taking: Reported on 02/24/2018) 10 tablet 0   No current facility-administered medications for this visit.  REVIEW OF SYSTEMS (Negative unless checked)  Constitutional: [] Weight loss  [] Fever  [] Chills Cardiac: [] Chest pain   [] Chest pressure   [] Palpitations   [] Shortness of breath when laying flat   [] Shortness of breath at rest   [] Shortness of breath with exertion. Vascular:  [] Pain in legs with walking   [] Pain in legs at rest   [] Pain in legs when laying flat   [] Claudication   [] Pain in feet when walking  [] Pain in feet at rest  [] Pain in feet when laying flat   [] History of DVT   [] Phlebitis   [x] Swelling in legs   [] Varicose veins   [] Non-healing ulcers Pulmonary:   [] Uses home oxygen   [] Productive cough   [] Hemoptysis   [] Wheeze  [] COPD   [] Asthma Neurologic:  [] Dizziness  [] Blackouts   [] Seizures   [] History of stroke   [] History of TIA  [] Aphasia   [] Temporary blindness   [] Dysphagia   [] Weakness or numbness in arms   [] Weakness or numbness in legs Musculoskeletal:  [x] Arthritis   [] Joint swelling    [x] Joint pain   [] Low back pain Hematologic:  [] Easy bruising  [] Easy bleeding   [] Hypercoagulable state   [] Anemic  [] Hepatitis Gastrointestinal:  [] Blood in stool   [] Vomiting blood  [] Gastroesophageal reflux/heartburn   [] Abdominal pain Genitourinary:  [x] Chronic kidney disease   [] Difficult urination  [] Frequent urination  [] Burning with urination   [] Hematuria Skin:  [] Rashes   [] Ulcers   [] Wounds Psychological:  [] History of anxiety   []  History of major depression.    Physical Exam BP (!) 113/58 (BP Location: Right Arm)   Pulse 67   Resp 18   Ht 5\' 5"  (075-GRM m)   Wt 137 lb (62.1 kg)   BMI 22.80 kg/m  Gen:  WD/WN, NAD Head: [place]/AT, No temporalis wasting.  Ear/Nose/Throat: Hearing grossly intact, nares w/o erythema or drainage, oropharynx w/o Erythema/Exudate Eyes: Conjunctiva clear, sclera non-icteric  Neck: trachea midline.  No JVD.  Pulmonary:  Good air movement, respirations not labored, no use of accessory muscles  Cardiac: RRR, no JVD Vascular:  Vessel Right Left  Radial Palpable Palpable               Musculoskeletal: M/S 5/5 throughout.  Extremities without ischemic changes.  No deformity or atrophy. No edema. Neurologic: Sensation grossly intact in extremities.  Symmetrical.  Speech is fluent. Motor exam as listed above. Psychiatric: Judgment intact, Mood & affect appropriate for pt's clinical situation. Dermatologic: No rashes or ulcers noted.  No cellulitis or open wounds.    Radiology No results found.  Labs No results found for this or any previous visit (from the past 2160 hour(s)).  Assessment/Plan: Hypertension An underlying cause of her renal failure and blood pressure control important in reducing the progression of atherosclerotic disease. On appropriate oral medications.   Diabetes mellitus without complication ([place]) An underlying cause of her renal failure and blood glucose control important in reducing the progression of atherosclerotic  disease. Also, involved in wound healing. On appropriate medications.  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  At this point, I think it would be reasonable to go ahead and proceed with graft placement.  She does not appear to require immediate dialysis which would necessitate a catheter immediately, but it is possible she may still need a catheter before her graft is ready to be used.  At this time, we will schedule  her for left arm AV graft placement risks and benefits are discussed and informed consent was obtained      Leotis Pain 07/06/2019, 3:07 PM   This note was created with Dragon medical transcription system.  Any errors from dictation are unintentional.

## 2019-07-06 NOTE — Patient Instructions (Signed)
Vascular Access for Hemodialysis        A vascular access is a connection to the blood inside your blood vessels that allows blood to be easily removed from your body and returned to your body during kidney dialysis (hemodialysis). Hemodialysis is a procedure in which a machine outside of the body filters the blood of a person whose kidneys are no longer working properly. There are three types of vascular accesses:  Arteriovenous fistula (AVF). This is a connection between an artery and a vein (usually in the arm) that is made by sewing them together. Blood in the artery flows directly into the vein, causing it to get larger over time. This makes it easier for the vein to be used for hemodialysis. An arteriovenous fistula takes 1-6 months to develop after surgery.  Arteriovenous graft (AVG). This is a connection between an artery and a vein in the arm that is made with a tube. An arteriovenous graft can be used within 2-3 weeks of surgery.  Venous catheter. This is a thin, flexible tube that is placed in a large vein (usually in the neck, chest, or groin). A venous catheter for hemodialysis contains two tubes that come out of the skin. A venous catheter can be used right away. It is usually used as a temporary access if you need hemodialysis before a fistula or graft has developed, or if kidney failure is sudden (acute) and likely to improve without the need for long-term dialysis. It may also be used as a permanent access if a fistula or graft cannot be created. Which type of access is best for me? The type of access that is best for you depends on the size and strength of your veins, your age, and any other health problems that you have, such as diabetes. An ultrasound test may be used to look at your veins to help make this decision. A fistula is usually the preferred type of access. It can last several years and is less likely than the other types of accesses to become infected or to cause a blood  clot within a blood vessel (thrombosis). However, a fistula is not an option for everyone. If your veins are not the right size or if the fistula does not develop properly, a graft may be used instead. Grafts require you to have strong veins. If your veins are not strong enough for a graft, a catheter may be used. Catheters are more likely than fistulas and grafts to become infected or to have a thrombosis. Sometimes, only one type of access is an option. Your health care provider will help you determine which type of access is best for you. How is a vascular access used? The way that the access is used depends on the type of access:  If the access is a fistula or graft, two needles are inserted through the skin into the access before each hemodialysis session. Blood leaves the body through one of the needles and travels through a tube to the hemodialysis machine (dialyzer). Then it flows through another tube and returns to the body through the second needle.  If the access is a catheter, one tube is connected directly to the tube that leads to the dialyzer, and the other tube is connected to a tube that leads away from the dialyzer. Blood leaves the body through one tube and returns to the body through the other. What problems can occur with vascular access?  A blood clot within a blood vessel (thrombosis).  Thrombosis can lead to a narrowing of a blood vessel (stenosis). If thrombosis occurs frequently, another access site may be created as a backup.  Infection.  Heart enlargement (cardiomegaly) and heart failure. Changes in blood flow may cause an increase in blood pressure or heart rate, making your heart work harder to pump blood. These problems are most likely to occur with a venous catheter and least likely to occur with an arteriovenous fistula. How do I care for my vascular access?  Wear a medical alert bracelet. In case of an emergency, this bracelet tells health care providers that you  are a dialysis patient and allows them to care for your veins appropriately. If you have a graft or fistula:  A "bruit" is a noise that is heard with a stethoscope, and a "thrill" is a vibration that is felt over the graft or fistula. The presence of the bruit and thrill indicates that the access is working. You will be taught to feel for the thrill each day. If this is not felt, the access may be clotted. Call your health care provider.  Keep your arm straight and raised (elevated) above your heart while the access site is healing.  You may freely use the arm where your vascular access is located after the site heals. Keep the following in mind: ? Avoid pressure on the arm. ? Avoid lifting heavy objects with the arm. ? Avoid sleeping on the arm. ? Avoid wearing tight-sleeved shirts or jewelry around the graft or fistula.  Do not allow blood pressure monitoring or needle punctures on the side where the graft or fistula is located.  With permission from your health care provider, you may do exercises to help with blood flow through a fistula. These exercises involve squeezing a rubber ball or other soft objects as instructed.  Wash your access site according to directions from your health care provider. If you have a venous catheter:  Keep the insertion site clean and dry at all times.  If you are told you can shower after the site heals, use a protective covering over the catheter to keep it dry.  Follow directions from your health care provider for bandage (dressing) changes.  Ask your health care provider what activities are safe for you. You may be restricted from lifting or making repetitive arm movements on the side with the catheter. Contact a health care provider if:  Swelling around the graft or fistula gets worse.  You develop new pain.  Your catheter gets damaged. Get help right away if:  You have pain, numbness, an unusual pale skin color, or blue fingers or sores at  the tips of your fingers in the hand on the side of your fistula.  You have chills.  You have a fever.  You have pus or other fluid (drainage) at the vascular access site.  You develop skin redness or red streaking on the skin around, above, or below the vascular access.  You have bleeding at the vascular access that cannot be easily controlled.  Your catheter gets pulled out of place.  You feel your heart racing or skipping beats.  You have chest pain. Summary  A vascular access is a connection to the blood inside your blood vessels that allows blood to be easily removed from your body and returned to your body during kidney dialysis (hemodialysis).  There are three types of vascular accesses.The type of access that is best for you depends on the size and strength of your  veins, your age, and any other health problems that you have, such as diabetes. °· A fistula is usually the preferred type of access, although it is not an option for everyone. It can last several years and is less likely than the other types of accesses to become infected or to cause a blood clot within a blood vessel (thrombosis). °· Wear a medical alert bracelet. In case of an emergency, this tells health care providers that you are a dialysis patient. °This information is not intended to replace advice given to you by your health care provider. Make sure you discuss any questions you have with your health care provider. °Document Released: 09/28/2002 Document Revised: 10/27/2018 Document Reviewed: 08/02/2016 °Elsevier Patient Education © 2020 [employer] ° °

## 2019-07-06 NOTE — Assessment & Plan Note (Signed)
The patient has had progression of her renal failure and is now nearing dialysis dependence.  At this point, I think it would be reasonable to go ahead and proceed with graft placement.  She does not appear to require immediate dialysis which would necessitate a catheter immediately, but it is possible she may still need a catheter before her graft is ready to be used.  At this time, we will schedule her for left arm AV graft placement risks and benefits are discussed and informed consent was obtained

## 2019-07-08 ENCOUNTER — Telehealth (INDEPENDENT_AMBULATORY_CARE_PROVIDER_SITE_OTHER): Payer: Self-pay

## 2019-07-08 NOTE — Telephone Encounter (Signed)
Spoke with Shelby Galloway with [facility] cardiology and the patient has not been seen since 2019. She stated that her schedulers would reach out to the patient to have her come in to be seen for cardiac clearance. A cardiac clearance form was faxed to [phone] to be faxed back when the patient has been cleared.

## 2019-07-14 ENCOUNTER — Encounter (INDEPENDENT_AMBULATORY_CARE_PROVIDER_SITE_OTHER): Payer: Self-pay

## 2019-07-14 ENCOUNTER — Telehealth (INDEPENDENT_AMBULATORY_CARE_PROVIDER_SITE_OTHER): Payer: Self-pay

## 2019-07-14 NOTE — Telephone Encounter (Signed)
Spoke with the patient and she is now scheduled with Dr. [facility] Cowboy for surgery on 08/04/19 at the [facility]. Patient will do a phone pre-op on 07/26/19 between 8-1 and covid testing on 08/02/19 between 12:30-2:30 pm at the [facility]. Pre-surgical instructions were discussed and will be mailed to the patient.

## 2019-07-14 NOTE — Telephone Encounter (Signed)
I attempted to contact the patient regarding getting scheduled for surgery and a message was left for a return call.

## 2019-07-25 ENCOUNTER — Other Ambulatory Visit (INDEPENDENT_AMBULATORY_CARE_PROVIDER_SITE_OTHER): Payer: Self-pay | Admitting: Nurse Practitioner

## 2019-07-26 ENCOUNTER — Other Ambulatory Visit: Payer: Self-pay

## 2019-07-26 ENCOUNTER — Encounter
Admission: RE | Admit: 2019-07-26 | Discharge: 2019-07-26 | Disposition: A | Discharge status: Home / Self Care | Payer: Medicare PPO | Source: Ambulatory Visit | Attending: Vascular Surgery | Admitting: Vascular Surgery

## 2019-07-26 HISTORY — DX: Unspecified osteoarthritis, unspecified site: M19.90

## 2019-07-26 NOTE — Patient Instructions (Signed)
INSTRUCTIONS FOR SURGERY     Your surgery is scheduled for:   January 13TH, 2021  (Wednesday)     To find out your arrival time for the day of surgery,          please call [phone] between 1 pm and 3 pm on :  Tuesday, January 12TH 2021     When you arrive for surgery, report to the [facility].       Do NOT stop on the first floor to register.    REMEMBER: Instructions that are not followed completely may result in serious medical risk,  up to and including death, or upon the discretion of your surgeon and anesthesiologist,            your surgery may need to be rescheduled.  __X__ 1. Do not eat food after midnight the night before your procedure.                    No gum, candy, lozenger, tic tacs, tums or hard candies.                  ABSOLUTELY NOTHING SOLID IN YOUR MOUTH AFTER MIDNIGHT                    You may drink unlimited clear liquids up to 2 hours before you are scheduled to arrive for surgery.                   Do not drink anything within those 2 hours unless you need to take medicine, then take the                   smallest amount you need.  Clear liquids include:  water, apple juice without pulp,                   any flavor Gatorade, Black coffee, black tea.  Sugar may be added but no dairy/ honey /lemon.                        Broth and jello is not considered a clear liquid.  __x__  2. On the morning of surgery, please brush your teeth with toothpaste and water. You may rinse with                  mouthwash if you wish but DO NOT SWALLOW TOOTHPASTE OR MOUTHWASH  __X___3. NO alcohol for 24 hours before or after surgery.  __x___ 4.  Do NOT smoke or use e-cigarettes for 24 HOURS PRIOR TO SURGERY.                      DO NOT Use any chewable tobacco products for at least 6 hours prior to surgery.  __x___ 5. If you start any new medication after this appointment and prior to surgery,  please                   Bring it with you on the day of surgery.  ___x__ 6. Notify your doctor if there is  any change in your medical condition, such as fever, infection,                     vomitting, diarrhea or any open sores.  __x___ 7.  USE the CHG SOAP as instructed, the night before surgery and the day of surgery.                   Once you have washed with this soap, do NOT use any of the following: Powders, perfumes                    or lotions. Please do not wear make up, hairpins, clips or nail polish. You may wear deodorant.                   Men may shave their face and neck.  Women need to shave 48 hours prior to surgery.                   DO NOT wear ANY jewelry on the day of surgery. If there are rings that are too tight to                    remove easily, please address this prior to the surgery day. Piercings need to be removed.                                                                     NO METAL ON YOUR BODY.                    Do NOT bring any valuables.  If you came to Pre-Admit testing then you will not need license,                     insurance card or credit card.  If you will be staying overnight, please either leave your things in                     the car or have your family be responsible for these items.                     [facility] IS NOT RESPONSIBLE FOR BELONGINGS OR VALUABLES.  ___X__ 8. DO NOT wear contact lenses on surgery day.  You may not have dentures,                     Hearing aides, contacts or glasses in the operating room. These items can be                    Placed in the Recovery Room to receive immediately after surgery.  __x___ 9. IF YOU ARE SCHEDULED TO GO HOME ON THE SAME DAY, YOU MUST                   Have someone to drive you home and to stay with you  for the first 24 hours.                    Have an arrangement prior to arriving on surgery day.  ___x__ 10.  Take the following medications on the morning of surgery with a  sip of water:                              1.  ALLOPURINOL                     2.  AMLODIPINE                     3.  FLONASE NASAL SPRAY                     4.  METOPROLOL                     5.  IMDUR                     6.  HYDRALAZINE                     7.  PRAVACHOL                     8.  CRESTOR  _____ 11.  Follow any instructions provided to you by your surgeon.                        Such as enema, clear liquid bowel prep  __X__  12. STOP  ASPIRIN AS OF:  TODAY,  January 4TH, 2021                       THIS INCLUDES BC POWDERS / GOODIES POWDER  __x___ 13. STOP Anti-inflammatories as of:  January 4TH, 2021                      This includes IBUPROFEN / MOTRIN / ADVIL / ALEVE/ NAPROXYN                    YOU MAY TAKE TYLENOL ANY TIME PRIOR TO SURGERY.  _____ 14.  Stop supplements until after surgery.                     This includes:  N/A                 You may continue taking Vitamin B12 / Vitamin D3 but do not take on the morning of surgery.  __X____17.  Continue to take the following medications but do not take on the morning of surgery:                           LASIX  // HYDROCHLOROTHIAZIDE // VITAMIN D3  // IRON // SODIUM BICARB   __X____18.   Wear clean and comfortable clothing to the hospital. hAVE Lillie!

## 2019-08-02 ENCOUNTER — Other Ambulatory Visit
Admission: RE | Admit: 2019-08-02 | Discharge: 2019-08-02 | Disposition: A | Discharge status: Home / Self Care | Payer: Medicare PPO | Source: Ambulatory Visit | Attending: Vascular Surgery | Admitting: Vascular Surgery

## 2019-08-02 ENCOUNTER — Other Ambulatory Visit: Payer: Self-pay

## 2019-08-02 DIAGNOSIS — Z01812 Encounter for preprocedural laboratory examination: Secondary | ICD-10-CM | POA: Diagnosis present

## 2019-08-02 DIAGNOSIS — Z20822 Contact with and (suspected) exposure to covid-19: Secondary | ICD-10-CM | POA: Insufficient documentation

## 2019-08-02 LAB — BASIC METABOLIC PANEL
Anion gap: 12 (ref 5–15)
BUN: 46 mg/dL — ABNORMAL HIGH (ref 8–23)
CO2: 21 mmol/L — ABNORMAL LOW (ref 22–32)
Calcium: 9.2 mg/dL (ref 8.9–10.3)
Chloride: 107 mmol/L (ref 98–111)
Creatinine, Ser: 4.88 mg/dL — ABNORMAL HIGH (ref 0.44–1.00)
GFR calc Af Amer: 10 mL/min — ABNORMAL LOW (ref >60)
GFR calc non Af Amer: 8 mL/min — ABNORMAL LOW (ref >60)
Glucose, Bld: 136 mg/dL — ABNORMAL HIGH (ref 70–99)
Potassium: 3.1 mmol/L — ABNORMAL LOW (ref 3.5–5.1)
Sodium: 140 mmol/L (ref 135–145)

## 2019-08-02 LAB — CBC WITH DIFFERENTIAL/PLATELET
Abs Immature Granulocytes: 0.01 10*3/uL (ref 0.00–0.07)
Basophils Absolute: 0.1 10*3/uL (ref 0.0–0.1)
Basophils Relative: 1 %
Eosinophils Absolute: 0.2 10*3/uL (ref 0.0–0.5)
Eosinophils Relative: 4 %
HCT: 24.3 % — ABNORMAL LOW (ref 36.0–46.0)
Hemoglobin: 7.9 g/dL — ABNORMAL LOW (ref 12.0–15.0)
Immature Granulocytes: 0 %
Lymphocytes Relative: 20 %
Lymphs Abs: 1.3 10*3/uL (ref 0.7–4.0)
MCH: 29.7 pg (ref 26.0–34.0)
MCHC: 32.5 g/dL (ref 30.0–36.0)
MCV: 91.4 fL (ref 80.0–100.0)
Monocytes Absolute: 0.4 10*3/uL (ref 0.1–1.0)
Monocytes Relative: 6 %
Neutro Abs: 4.6 10*3/uL (ref 1.7–7.7)
Neutrophils Relative %: 69 %
Platelets: 249 10*3/uL (ref 150–400)
RBC: 2.66 MIL/uL — ABNORMAL LOW (ref 3.87–5.11)
RDW: 13.5 % (ref 11.5–15.5)
WBC: 6.6 10*3/uL (ref 4.0–10.5)
nRBC: 0 % (ref 0.0–0.2)

## 2019-08-02 LAB — APTT: aPTT: 26 seconds (ref 24–36)

## 2019-08-02 LAB — PROTIME-INR
INR: 1.1 (ref 0.8–1.2)
Prothrombin Time: 13.8 seconds (ref 11.4–15.2)

## 2019-08-02 LAB — TYPE AND SCREEN
ABO/RH(D): O NEG
Antibody Screen: NEGATIVE

## 2019-08-02 LAB — SARS CORONAVIRUS 2 (TAT 6-24 HRS): SARS Coronavirus 2: NEGATIVE

## 2019-08-02 NOTE — Pre-Procedure Instructions (Signed)
Notified [facility] of pt's lab values today thru secure chat.

## 2019-08-03 MED ORDER — CEFAZOLIN SODIUM-DEXTROSE 1-4 GM/50ML-% IV SOLN
1.0000 g | INTRAVENOUS | Status: AC
  Administered 2019-08-04: 1 g via INTRAVENOUS

## 2019-08-04 ENCOUNTER — Encounter
Admission: RE | Disposition: A | Discharge status: Home / Self Care | Payer: Self-pay | Source: Home / Self Care | Attending: Vascular Surgery

## 2019-08-04 ENCOUNTER — Ambulatory Visit: Payer: Medicare PPO | Admitting: Anesthesiology

## 2019-08-04 ENCOUNTER — Other Ambulatory Visit: Payer: Self-pay

## 2019-08-04 ENCOUNTER — Encounter: Payer: Self-pay | Admitting: Vascular Surgery

## 2019-08-04 ENCOUNTER — Ambulatory Visit
Admission: RE | Admit: 2019-08-04 | Discharge: 2019-08-04 | Disposition: A | Discharge status: Home / Self Care | Payer: Medicare PPO | Attending: Vascular Surgery | Admitting: Vascular Surgery

## 2019-08-04 ENCOUNTER — Ambulatory Visit: Payer: Medicare PPO

## 2019-08-04 DIAGNOSIS — Z7984 Long term (current) use of oral hypoglycemic drugs: Secondary | ICD-10-CM | POA: Diagnosis not present

## 2019-08-04 DIAGNOSIS — E1122 Type 2 diabetes mellitus with diabetic chronic kidney disease: Secondary | ICD-10-CM | POA: Insufficient documentation

## 2019-08-04 DIAGNOSIS — Z419 Encounter for procedure for purposes other than remedying health state, unspecified: Secondary | ICD-10-CM

## 2019-08-04 DIAGNOSIS — I120 Hypertensive chronic kidney disease with stage 5 chronic kidney disease or end stage renal disease: Secondary | ICD-10-CM | POA: Diagnosis not present

## 2019-08-04 DIAGNOSIS — Z79899 Other long term (current) drug therapy: Secondary | ICD-10-CM | POA: Insufficient documentation

## 2019-08-04 DIAGNOSIS — M109 Gout, unspecified: Secondary | ICD-10-CM | POA: Insufficient documentation

## 2019-08-04 DIAGNOSIS — N185 Chronic kidney disease, stage 5: Secondary | ICD-10-CM | POA: Diagnosis present

## 2019-08-04 DIAGNOSIS — Z87891 Personal history of nicotine dependence: Secondary | ICD-10-CM | POA: Insufficient documentation

## 2019-08-04 HISTORY — PX: AV FISTULA PLACEMENT: SHX1204

## 2019-08-04 LAB — ABO/RH: ABO/RH(D): O NEG

## 2019-08-04 LAB — GLUCOSE, CAPILLARY
Glucose-Capillary: 112 mg/dL — ABNORMAL HIGH (ref 70–99)
Glucose-Capillary: 146 mg/dL — ABNORMAL HIGH (ref 70–99)

## 2019-08-04 SURGERY — INSERTION OF ARTERIOVENOUS (AV) GORE-TEX GRAFT ARM
Anesthesia: Regional | Laterality: Left

## 2019-08-04 MED ORDER — MIDAZOLAM HCL 2 MG/2ML IJ SOLN
INTRAMUSCULAR | Status: AC
  Filled 2019-08-04: qty 2

## 2019-08-04 MED ORDER — EPHEDRINE SULFATE 50 MG/ML IJ SOLN
INTRAMUSCULAR | Status: DC | PRN
  Administered 2019-08-04: 50 mg via INTRAVENOUS

## 2019-08-04 MED ORDER — PROPOFOL 10 MG/ML IV BOLUS
INTRAVENOUS | Status: DC | PRN
  Administered 2019-08-04: 20 ug via INTRAVENOUS

## 2019-08-04 MED ORDER — BUPIVACAINE HCL (PF) 0.5 % IJ SOLN
INTRAMUSCULAR | Status: AC
  Filled 2019-08-04: qty 30

## 2019-08-04 MED ORDER — MIDAZOLAM HCL 2 MG/2ML IJ SOLN
INTRAMUSCULAR | Status: DC | PRN
  Administered 2019-08-04: .5 mg via INTRAVENOUS

## 2019-08-04 MED ORDER — MIDAZOLAM HCL 2 MG/2ML IJ SOLN
1.0000 mg | Freq: Once | INTRAMUSCULAR | Status: AC
  Administered 2019-08-04: 1 mg via INTRAVENOUS

## 2019-08-04 MED ORDER — FAMOTIDINE 20 MG PO TABS: 20.0000 mg | ORAL_TABLET | Freq: Once | ORAL | Status: AC

## 2019-08-04 MED ORDER — LIDOCAINE HCL (CARDIAC) PF 100 MG/5ML IV SOSY
PREFILLED_SYRINGE | INTRAVENOUS | Status: DC | PRN
  Administered 2019-08-04: 50 mg via INTRAVENOUS

## 2019-08-04 MED ORDER — ONDANSETRON HCL 4 MG/2ML IJ SOLN: 4.0000 mg | Freq: Four times a day (QID) | INTRAMUSCULAR | Status: DC | PRN

## 2019-08-04 MED ORDER — FENTANYL CITRATE (PF) 100 MCG/2ML IJ SOLN
INTRAMUSCULAR | Status: DC | PRN
  Administered 2019-08-04: 25 ug via INTRAVENOUS

## 2019-08-04 MED ORDER — HYDROMORPHONE HCL 1 MG/ML IJ SOLN: 1.0000 mg | Freq: Once | INTRAMUSCULAR | Status: DC | PRN

## 2019-08-04 MED ORDER — ONDANSETRON HCL 4 MG/2ML IJ SOLN
INTRAMUSCULAR | Status: DC | PRN
  Administered 2019-08-04: 4 mg via INTRAVENOUS

## 2019-08-04 MED ORDER — BUPIVACAINE HCL (PF) 0.5 % IJ SOLN
INTRAMUSCULAR | Status: DC | PRN
  Administered 2019-08-04: 30 mg

## 2019-08-04 MED ORDER — HEPARIN SODIUM (PORCINE) 5000 UNIT/ML IJ SOLN
INTRAMUSCULAR | Status: AC
  Filled 2019-08-04: qty 1

## 2019-08-04 MED ORDER — CHLORHEXIDINE GLUCONATE CLOTH 2 % EX PADS
6.0000 | MEDICATED_PAD | Freq: Once | CUTANEOUS | Status: AC
  Administered 2019-08-04: 6 via TOPICAL

## 2019-08-04 MED ORDER — SODIUM CHLORIDE 0.9 % IV SOLN: INTRAVENOUS | Status: DC

## 2019-08-04 MED ORDER — MICROFIBRILLAR COLL HEMOSTAT EX PADS
MEDICATED_PAD | CUTANEOUS | Status: DC | PRN
  Administered 2019-08-04: 1 via TOPICAL

## 2019-08-04 MED ORDER — EPHEDRINE SULFATE 50 MG/ML IJ SOLN
INTRAMUSCULAR | Status: AC
  Filled 2019-08-04: qty 1

## 2019-08-04 MED ORDER — DEXAMETHASONE SODIUM PHOSPHATE 10 MG/ML IJ SOLN
INTRAMUSCULAR | Status: AC
  Filled 2019-08-04: qty 1

## 2019-08-04 MED ORDER — FENTANYL CITRATE (PF) 100 MCG/2ML IJ SOLN: 25.0000 ug | INTRAMUSCULAR | Status: DC | PRN

## 2019-08-04 MED ORDER — DEXAMETHASONE SODIUM PHOSPHATE 10 MG/ML IJ SOLN
INTRAMUSCULAR | Status: DC | PRN
  Administered 2019-08-04 (×3): 5 mg via INTRAVENOUS

## 2019-08-04 MED ORDER — FENTANYL CITRATE (PF) 100 MCG/2ML IJ SOLN
INTRAMUSCULAR | Status: AC
  Filled 2019-08-04: qty 2

## 2019-08-04 MED ORDER — TRAMADOL HCL 50 MG PO TABS: 50.0000 mg | ORAL_TABLET | Freq: Four times a day (QID) | ORAL | 0 refills | Status: AC | PRN

## 2019-08-04 MED ORDER — MIDAZOLAM HCL 2 MG/2ML IJ SOLN
INTRAMUSCULAR | Status: AC
  Administered 2019-08-04: 1 mg via INTRAVENOUS
  Filled 2019-08-04: qty 2

## 2019-08-04 MED ORDER — PROPOFOL 500 MG/50ML IV EMUL
INTRAVENOUS | Status: DC | PRN
  Administered 2019-08-04: 50 ug/kg/min via INTRAVENOUS

## 2019-08-04 MED ORDER — HEPARIN SODIUM (PORCINE) 1000 UNIT/ML IJ SOLN
INTRAMUSCULAR | Status: DC | PRN
  Administered 2019-08-04: 3000 [IU] via INTRAVENOUS

## 2019-08-04 MED ORDER — OXYCODONE HCL 5 MG/5ML PO SOLN: 5.0000 mg | Freq: Once | ORAL | Status: DC | PRN

## 2019-08-04 MED ORDER — ONDANSETRON HCL 4 MG/2ML IJ SOLN
INTRAMUSCULAR | Status: AC
  Filled 2019-08-04: qty 2

## 2019-08-04 MED ORDER — CEFAZOLIN SODIUM-DEXTROSE 1-4 GM/50ML-% IV SOLN
INTRAVENOUS | Status: AC
  Filled 2019-08-04: qty 50

## 2019-08-04 MED ORDER — EPINEPHRINE PF 1 MG/ML IJ SOLN
INTRAMUSCULAR | Status: AC
  Filled 2019-08-04: qty 1

## 2019-08-04 MED ORDER — PROPOFOL 500 MG/50ML IV EMUL
INTRAVENOUS | Status: AC
  Filled 2019-08-04: qty 50

## 2019-08-04 MED ORDER — OXYCODONE HCL 5 MG PO TABS: 5.0000 mg | ORAL_TABLET | Freq: Once | ORAL | Status: DC | PRN

## 2019-08-04 MED ORDER — FAMOTIDINE 20 MG PO TABS
ORAL_TABLET | ORAL | Status: AC
  Administered 2019-08-04: 20 mg via ORAL
  Filled 2019-08-04: qty 1

## 2019-08-04 SURGICAL SUPPLY — 54 items
BAG DECANTER FOR FLEXI CONT (MISCELLANEOUS) ×3 IMPLANT
BLADE SURG SZ11 CARB STEEL (BLADE) ×3 IMPLANT
BOOT SUTURE AID YELLOW STND (SUTURE) ×3 IMPLANT
BRUSH SCRUB EZ  4% CHG (MISCELLANEOUS) ×2
BRUSH SCRUB EZ 4% CHG (MISCELLANEOUS) ×1 IMPLANT
CANISTER SUCT 1200ML W/VALVE (MISCELLANEOUS) ×3 IMPLANT
CHLORAPREP W/TINT 26 (MISCELLANEOUS) ×3 IMPLANT
CLIP SPRNG 6 S-JAW DBL (CLIP) ×1 IMPLANT
CLIP SPRNG 6MM S-JAW DBL (CLIP) ×3
COVER WAND RF STERILE (DRAPES) ×3 IMPLANT
DECANTER SPIKE VIAL GLASS SM (MISCELLANEOUS) ×1 IMPLANT
DERMABOND ADVANCED (GAUZE/BANDAGES/DRESSINGS) ×2
DERMABOND ADVANCED .7 DNX12 (GAUZE/BANDAGES/DRESSINGS) ×1 IMPLANT
ELECT CAUTERY BLADE 6.4 (BLADE) ×3 IMPLANT
ELECT REM PT RETURN 9FT ADLT (ELECTROSURGICAL) ×3
ELECTRODE REM PT RTRN 9FT ADLT (ELECTROSURGICAL) ×1 IMPLANT
GLOVE BIO SURGEON STRL SZ7 (GLOVE) ×6 IMPLANT
GLOVE INDICATOR 7.5 STRL GRN (GLOVE) ×3 IMPLANT
GOWN STRL REUS W/ TWL LRG LVL3 (GOWN DISPOSABLE) ×1 IMPLANT
GOWN STRL REUS W/ TWL XL LVL3 (GOWN DISPOSABLE) ×2 IMPLANT
GOWN STRL REUS W/TWL LRG LVL3 (GOWN DISPOSABLE) ×4
GOWN STRL REUS W/TWL XL LVL3 (GOWN DISPOSABLE) ×4
HEMOSTAT SURGICEL 2X3 (HEMOSTASIS) ×3 IMPLANT
IV NS 500ML (IV SOLUTION) ×2
IV NS 500ML BAXH (IV SOLUTION) ×1 IMPLANT
KIT TURNOVER KIT A (KITS) ×3 IMPLANT
LABEL OR SOLS (LABEL) ×1 IMPLANT
LOOP RED MAXI  1X406MM (MISCELLANEOUS) ×2
LOOP VESSEL MAXI 1X406 RED (MISCELLANEOUS) ×1 IMPLANT
LOOP VESSEL MINI 0.8X406 BLUE (MISCELLANEOUS) ×1 IMPLANT
LOOPS BLUE MINI 0.8X406MM (MISCELLANEOUS) ×2
NDL FILTER BLUNT 18X1 1/2 (NEEDLE) ×1 IMPLANT
NEEDLE FILTER BLUNT 18X 1/2SAF (NEEDLE) ×2
NEEDLE FILTER BLUNT 18X1 1/2 (NEEDLE) ×1 IMPLANT
NS IRRIG 500ML POUR BTL (IV SOLUTION) ×3 IMPLANT
PACK EXTREMITY ARMC (MISCELLANEOUS) ×3 IMPLANT
PAD PREP 24X41 OB/GYN DISP (PERSONAL CARE ITEMS) ×3 IMPLANT
SOLUTION CELL SAVER (CLIP) ×1 IMPLANT
STOCKINETTE 48X4 2 PLY STRL (GAUZE/BANDAGES/DRESSINGS) ×1 IMPLANT
STOCKINETTE STRL 4IN 9604848 (GAUZE/BANDAGES/DRESSINGS) ×3 IMPLANT
SUT GORETEX CV-6TTC-13 36IN (SUTURE) ×6 IMPLANT
SUT MNCRL AB 4-0 PS2 18 (SUTURE) ×3 IMPLANT
SUT PROLENE 6 0 BV (SUTURE) ×10 IMPLANT
SUT SILK 0 SH 30 (SUTURE) ×3 IMPLANT
SUT SILK 2 0 (SUTURE) ×2
SUT SILK 2-0 18XBRD TIE 12 (SUTURE) ×1 IMPLANT
SUT SILK 3 0 (SUTURE) ×2
SUT SILK 3-0 18XBRD TIE 12 (SUTURE) ×1 IMPLANT
SUT SILK 4 0 (SUTURE) ×2
SUT SILK 4-0 18XBRD TIE 12 (SUTURE) ×1 IMPLANT
SUT VIC AB 3-0 SH 27 (SUTURE) ×2
SUT VIC AB 3-0 SH 27X BRD (SUTURE) ×1 IMPLANT
SYR 20ML LL LF (SYRINGE) ×3 IMPLANT
SYR 3ML LL SCALE MARK (SYRINGE) ×3 IMPLANT

## 2019-08-04 NOTE — Anesthesia Preprocedure Evaluation (Signed)
Anesthesia Evaluation  Patient identified by MRN, date of birth, ID band Patient awake    Reviewed: Allergy & Precautions, NPO status , Patient's Chart, lab work & pertinent test results  History of Anesthesia Complications Negative for: history of anesthetic complications  Airway Mallampati: II  TM Distance: >3 FB Neck ROM: Full    Dental no notable dental hx. (+) Teeth Intact, Edentulous Upper, Partial Lower, Dental Advisory Given   Pulmonary neg pulmonary ROS, neg sleep apnea, neg COPD, Patient abstained from smoking.Not current smoker, former smoker,    Pulmonary exam normal breath sounds clear to auscultation       Cardiovascular Exercise Tolerance: Good METShypertension, Pt. on medications (-) CAD and (-) Past MI (-) dysrhythmias  Rhythm:Regular Rate:Normal - Systolic murmurs    Neuro/Psych negative neurological ROS  negative psych ROS   GI/Hepatic neg GERD  ,(+)     (-) substance abuse  ,   Endo/Other  diabetes, Well Controlled, Oral Hypoglycemic Agents  Renal/GU ESRFRenal disease     Musculoskeletal   Abdominal   Peds  Hematology  (+) anemia ,   Anesthesia Other Findings Past Medical History: No date: Arthritis No date: Diabetes mellitus without complication (HCC) No date: Gout No date: Hypertension No date: Renal disorder     Comment:  stage 5 ckd  Reproductive/Obstetrics                             Anesthesia Physical Anesthesia Plan  ASA: III  Anesthesia Plan: Regional   Post-op Pain Management:    Induction: Intravenous  PONV Risk Score and Plan: 2 and Ondansetron and Dexamethasone  Airway Management Planned: Natural Airway and LMA  Additional Equipment: None  Intra-op Plan:   Post-operative Plan: Extubation in OR  Informed Consent: I have reviewed the patients History and Physical, chart, labs and discussed the procedure including the risks, benefits and  alternatives for the proposed anesthesia with the patient or authorized representative who has indicated his/her understanding and acceptance.     Dental advisory given  Plan Discussed with: CRNA and Surgeon  Anesthesia Plan Comments: (Discussed risks of anesthesia with patient, including PONV, sore throat, lip/dental damage. Rare risks discussed as well, such as cardiorespiratory sequelae. Patient understands.  Discussed r/b/a of supraclavicular block, including elective nature. Risks discussed: - Rare: bleeding, infection, nerve damage - shortness of breath from hemidiaphragmatic paralysis - poor/non-working blocks Patient understands and agrees. )        Anesthesia Quick Evaluation

## 2019-08-04 NOTE — Transfer of Care (Cosign Needed)
Immediate Anesthesia Transfer of Care Note  Patient: Shelby Galloway [facility]  Procedure(s) Performed: INSERTION OF ARTERIOVENOUS (AV) GORE-TEX GRAFT ARM (Left )  Patient Location: PACU  Anesthesia Type:General  Level of Consciousness: awake and alert   Airway & Oxygen Therapy: Patient Spontanous Breathing  Post-op Assessment: Report given to RN and Post -op Vital signs reviewed and stable  Post vital signs: Reviewed and stable  Last Vitals:  Vitals Value Taken Time  BP 118/65 08/04/19 1442  Temp 97.1 F   Pulse 71 08/04/19 1444  Resp 16 08/04/19 1444  SpO2 94 % 08/04/19 1444  Vitals shown include unvalidated device data.  Last Pain:  Vitals:   08/04/19 1156  PainSc: Asleep         Complications: No apparent anesthesia complications

## 2019-08-04 NOTE — Discharge Instructions (Signed)

## 2019-08-04 NOTE — H&P (Signed)
[facility] History & Physical Update  The patient was interviewed and re-examined.  The patient's previous History and Physical has been reviewed and is unchanged.  There is no change in the plan of care. We plan to proceed with the scheduled procedure.  Leotis Pain, MD  08/04/2019, 12:20 PM

## 2019-08-04 NOTE — Op Note (Signed)
[facility]   OPERATIVE NOTE   PROCEDURE: Left brachiocephalic arteriovenous fistula placement  PRE-OPERATIVE DIAGNOSIS: CKD nearing dialysis dependence  POST-OPERATIVE DIAGNOSIS: same as above  SURGEON: Leotis Pain, MD  ASSISTANT(S): none  ANESTHESIA: general  ESTIMATED BLOOD LOSS: 10 cc  FINDING(S): Adequate cephalic vein for fistula creation  SPECIMEN(S):  none  INDICATIONS:   Shelby Galloway is a 74 y.o. female who presents with renal failure in need of pemanent dialysis acces.  The patient is scheduled for left arm access placement.  The patient is aware the risks include but are not limited to: bleeding, infection, steal syndrome, nerve damage, ischemic monomelic neuropathy, failure to mature, and need for additional procedures.  The patient is aware of the risks of the procedure and elects to proceed forward.  DESCRIPTION: After full informed written consent was obtained from the patient, the patient was brought back to the operating room and placed supine upon the operating table.  Prior to induction, the patient received IV antibiotics.   After obtaining adequate anesthesia, the patient was then prepped and draped in the standard fashion for a left arm access procedure.  I made a curvilinear incision at the level of the antecubital fossa and dissected through the subcutaneous tissue and fascia to gain exposure of the brachial artery.  This was noted to be patent and adequate in size for fistula creation.  This was dissected out proximally and distally and prepared for control with vessel loops .  I then dissected out the cephalic vein.  This was noted to be patent and adequate in size for fistula creation.  This was a pleasant surprise as preoperative vein mapping had indicated that the cephalic vein would not be usable for fistula creation.   I then gave the patient 3000 units of intravenous heparin.  The vein was marked for orientation and the distal  segment of the vein was ligated with a  2-0 silk, and the vein was transected.  I then interrogated the vein to ensure that it was adequate for fistula creation.  I used a 2 mm, 2.5 mm, 3 mm, and a 3.5 mm Garrett dilator and passed this through the cephalic vein without difficulty.  I then instilled the heparinized saline into the vein and clamped it.  At this point, I reset my exposure of the brachial artery and pulled up control on the vessel loops.  I made an arteriotomy with a #11 blade, and then I extended the arteriotomy with a Potts scissor.  I injected heparinized saline proximal and distal to this arteriotomy.  The vein was then sewn to the artery in an end-to-side configuration with a running stitch of 6-0 Prolene.  Prior to completing this anastomosis, I allowed the vein and artery to backbleed.  There was no evidence of clot from any vessels.  I completed the anastomosis in the usual fashion and then released all vessel loops and clamps.  There was a palpable  thrill in the venous outflow, and there was a palpable pulse in the artery distal to the anastomosis.  At this point, I irrigated out the surgical wound.  Surgicel was placed. There was no further active bleeding.  The subcutaneous tissue was reapproximated with a running stitch of 3-0 Vicryl.  The skin was then closed with a 4-0 Monocryl suture.  The skin was then cleaned, dried, and reinforced with Dermabond.  The patient tolerated this procedure well and was taken to the recovery room in stable condition  COMPLICATIONS: None  CONDITION: Stable   Leotis Pain    08/04/2019, 2:52 PM  This note was created with Dragon Medical transcription system. Any errors in dictation are purely unintentional.

## 2019-08-04 NOTE — Anesthesia Procedure Notes (Signed)
Date/Time: 08/04/2019 1:30 PM Performed by: Allean Found, CRNA Pre-anesthesia Checklist: Patient identified, Emergency Drugs available, Suction available, Patient being monitored and Timeout performed Oxygen Delivery Method: Simple face mask Placement Confirmation: positive ETCO2

## 2019-08-04 NOTE — Anesthesia Procedure Notes (Signed)
Anesthesia Regional Block: Supraclavicular block   Pre-Anesthetic Checklist: ,, timeout performed, Correct Patient, Correct Site, Correct Laterality, Correct Procedure, Correct Position, site marked, Risks and benefits discussed,  Surgical consent,  Pre-op evaluation,  At surgeon's request and post-op pain management  Laterality: Left  Prep: chloraprep       Needles:  Injection technique: Single-shot  Needle Type: Echogenic Needle     Needle Length: 4cm  Needle Gauge: 25     Additional Needles:   Narrative:  Start time: 08/04/2019 11:55 AM End time: 08/04/2019 12:02 PM Injection made incrementally with aspirations every 5 mL.  Performed by: Personally  Anesthesiologist: Arita Miss, MD  Additional Notes: Patient consented for risk and benefits of nerve block including but not limited to: nerve damage, failed block, bleeding and infection, hemidiaphragmatic paralysis and shortness of breath, Horner's syndrome.  Patient voiced understanding.  Functioning IV was confirmed and monitors were applied.  Sterile prep,hand hygiene and sterile gloves were used.  Minimal sedation used for procedure (2mg  midazolam).   No paresthesia endorsed by patient during the procedure.  Negative aspiration and negative test dose prior to incremental administration of local anesthetic. The patient tolerated the procedure well with no immediate complications.

## 2019-08-05 NOTE — Anesthesia Postprocedure Evaluation (Signed)
Anesthesia Post Note  Patient: [name] [facility]  Procedure(s) Performed: INSERTION OF ARTERIOVENOUS (AV) GORE-TEX GRAFT ARM (Left )  Patient location during evaluation: PACU Anesthesia Type: Regional and General Level of consciousness: awake and alert Pain management: pain level controlled Vital Signs Assessment: post-procedure vital signs reviewed and stable Respiratory status: spontaneous breathing, nonlabored ventilation, respiratory function stable and patient connected to nasal cannula oxygen Cardiovascular status: blood pressure returned to baseline and stable Postop Assessment: no apparent nausea or vomiting Anesthetic complications: no     Last Vitals:  Vitals:   08/04/19 1542 08/04/19 1615  BP: (!) 121/57 136/63  Pulse: 67 73  Resp: 16 16  Temp: (!) 36.3 C   SpO2: 97% 97%    Last Pain:  Vitals:   08/04/19 1615  TempSrc:   PainSc: 0-No pain                 Tera Mater

## 2019-09-07 ENCOUNTER — Other Ambulatory Visit (INDEPENDENT_AMBULATORY_CARE_PROVIDER_SITE_OTHER): Payer: Self-pay | Admitting: Vascular Surgery

## 2019-09-07 DIAGNOSIS — Z9889 Other specified postprocedural states: Secondary | ICD-10-CM

## 2019-09-07 DIAGNOSIS — N189 Chronic kidney disease, unspecified: Secondary | ICD-10-CM

## 2019-09-08 ENCOUNTER — Ambulatory Visit (INDEPENDENT_AMBULATORY_CARE_PROVIDER_SITE_OTHER): Payer: Medicare PPO

## 2019-09-08 ENCOUNTER — Other Ambulatory Visit: Payer: Self-pay

## 2019-09-08 ENCOUNTER — Ambulatory Visit (INDEPENDENT_AMBULATORY_CARE_PROVIDER_SITE_OTHER): Payer: Medicare PPO | Admitting: Nurse Practitioner

## 2019-09-08 ENCOUNTER — Ambulatory Visit: Admit: 2019-09-08 | Payer: Medicare PPO | Admitting: Internal Medicine

## 2019-09-08 ENCOUNTER — Encounter (INDEPENDENT_AMBULATORY_CARE_PROVIDER_SITE_OTHER): Payer: Self-pay | Admitting: Nurse Practitioner

## 2019-09-08 VITALS — BP 153/66 | HR 64 | Resp 16 | Wt 140.8 lb

## 2019-09-08 DIAGNOSIS — Z9889 Other specified postprocedural states: Secondary | ICD-10-CM | POA: Diagnosis not present

## 2019-09-08 DIAGNOSIS — N184 Chronic kidney disease, stage 4 (severe): Secondary | ICD-10-CM

## 2019-09-08 DIAGNOSIS — I10 Essential (primary) hypertension: Secondary | ICD-10-CM | POA: Diagnosis not present

## 2019-09-08 DIAGNOSIS — N189 Chronic kidney disease, unspecified: Secondary | ICD-10-CM

## 2019-09-08 DIAGNOSIS — E119 Type 2 diabetes mellitus without complications: Secondary | ICD-10-CM

## 2019-09-08 SURGERY — COLONOSCOPY WITH PROPOFOL
Anesthesia: General

## 2019-09-08 NOTE — Progress Notes (Signed)
SUBJECTIVE:  Patient ID: Shelby Galloway, female    DOB: 1945-12-07, 74 y.o.   MRN: [id number] Chief Complaint  Patient presents with  . Follow-up    [facility] 5week post fistula    HPI  Shelby Galloway is a 74 y.o. female that presents today as a first look with her left brachiocephalic AV fistula.  Currently the patient has chronic kidney disease stage V and is not yet on dialysis.  The patient denies any uremic-like symptoms or shortness of breath or any symptoms associated with fluid overload.  Overall the patient states that she feels well.  She also denies any signs and symptoms of hyperkalemia although she does endorse that it has been an issue for her.  She has an upcoming visit with her nephrologist in about a month or so to determine if she will need dialysis or if she can continue to hold off for some time.  She reports feeling well following her surgery however there is still some swelling at the medial portion of her elbow.  She denies any numbness, tingling or coldness of her fingertips.  Today the patient has a flow volume of 1872.  The patient has no evidence of significant stenosis.  No significant stenosis seen.  Past Medical History:  Diagnosis Date  . Arthritis   . Diabetes mellitus without complication ([place])   . Gout   . Hypertension   . Renal disorder    stage 5 ckd    Past Surgical History:  Procedure Laterality Date  . ABDOMINAL HYSTERECTOMY    . AV FISTULA PLACEMENT Left 08/04/2019   Procedure: INSERTION OF ARTERIOVENOUS (AV) GORE-TEX GRAFT ARM;  Surgeon: Algernon Huxley, MD;  Location: [facility] ORS;  Service: Vascular;  Laterality: Left;    Social History   Socioeconomic History  . Marital status: Married    Spouse name: Shelby Galloway  . Number of children: Not on file  . Years of education: Not on file  . Highest education level: Not on file  Occupational History  . 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  Smoking status: Former Smoker    Packs/day: 1.00    Types: Cigarettes    Quit date: 2010    Years since quitting: 11.1  . Smokeless tobacco: Never Used  Substance and Sexual Activity  . Alcohol use: No  . Drug use: Never  . Sexual activity: Not Currently  Other Topics Concern  . Not on file  Social History Narrative  . Not on file   Social Determinants of Health   Financial Resource Strain:   . Difficulty of Paying Living Expenses: Not on file  Food Insecurity:   . Worried About Charity fundraiser in the Last Year: Not on file  . Ran Out of Food in the Last Year: Not on file  Transportation Needs:   . Lack of Transportation (Medical): Not on file  . Lack of Transportation (Non-Medical): Not on file  Physical Activity:   . Days of Exercise per Week: Not on file  . Minutes of Exercise per Session: Not on file  Stress:   . Feeling of Stress : Not on file  Social Connections:   . Frequency of Communication with Friends and Family: Not on file  . Frequency of Social Gatherings with Friends and Family: Not on file  . Attends Religious Services: Not on file  . Active Member of Clubs or Organizations:  Not on file  . Attends Archivist Meetings: Not on file  . Marital Status: Not on file  Intimate Partner Violence:   . Fear of Current or Ex-Partner: Not on file  . Emotionally Abused: Not on file  . Physically Abused: Not on file  . Sexually Abused: Not on file    Family History  Problem Relation Age of Onset  . Breast cancer Neg Hx     Allergies  Allergen Reactions  . Losartan     States worsened gout  . Ace Inhibitors     Sinus issues  . Atorvastatin     Sinus side effects  . Chlorthalidone     Sinus issues  . Codeine Other (See Comments)    States she feels "funny " in the head     Review of Systems   Review of Systems: Negative Unless Checked Constitutional: [] Weight loss  [] Fever  [] Chills Cardiac: [] Chest pain   []  Atrial Fibrillation   [] Palpitations   [] Shortness of breath when laying flat   [] Shortness of breath with exertion. [] Shortness of breath at rest Vascular:  [] Pain in legs with walking   [] Pain in legs with standing [] Pain in legs when laying flat   [] Claudication    [] Pain in feet when laying flat    [] History of DVT   [] Phlebitis   [] Swelling in legs   [] Varicose veins   [] Non-healing ulcers Pulmonary:   [] Uses home oxygen   [] Productive cough   [] Hemoptysis   [] Wheeze  [] COPD   [] Asthma Neurologic:  [] Dizziness   [] Seizures  [] Blackouts [] History of stroke   [] History of TIA  [] Aphasia   [] Temporary Blindness   [] Weakness or numbness in arm   [] Weakness or numbness in leg Musculoskeletal:   [] Joint swelling   [] Joint pain   [] Low back pain  []  History of Knee Replacement [x] Arthritis [] back Surgeries  []  Spinal Stenosis    Hematologic:  [] Easy bruising  [] Easy bleeding   [] Hypercoagulable state   [] Anemic Gastrointestinal:  [] Diarrhea   [] Vomiting  [] Gastroesophageal reflux/heartburn   [] Difficulty swallowing. [] Abdominal pain Genitourinary:  [x] Chronic kidney disease   [] Difficult urination  [] Anuric   [] Blood in urine [] Frequent urination  [] Burning with urination   [] Hematuria Skin:  [] Rashes   [] Ulcers [] Wounds Psychological:  [] History of anxiety   []  History of major depression  []  Memory Difficulties      OBJECTIVE:   Physical Exam  BP (!) 153/66 (BP Location: Right Arm)   Pulse 64   Resp 16   Wt 140 lb 12.8 oz (63.9 kg)   BMI 23.43 kg/m   Gen: WD/WN, NAD Head: [place]/AT, No temporalis wasting.  Ear/Nose/Throat: Hearing grossly intact, nares w/o erythema or drainage Eyes: PER, EOMI, sclera nonicteric.  Neck: Supple, no masses.  No JVD.  Pulmonary:  Good air movement, no use of accessory muscles.  Cardiac: RRR Vascular:  Good thrill and bruit Vessel Right Left  Radial Palpable Palpable   Gastrointestinal: soft, non-distended. No guarding/no peritoneal signs.  Musculoskeletal: M/S 5/5 throughout.   No deformity or atrophy.  Neurologic: Pain and light touch intact in extremities.  Symmetrical.  Speech is fluent. Motor exam as listed above. Psychiatric: Judgment intact, Mood & affect appropriate for pt's clinical situation. Dermatologic: No Venous rashes. No Ulcers Noted.  No changes consistent with cellulitis. Lymph : No Cervical lymphadenopathy, no lichenification or skin changes of chronic lymphedema.       ASSESSMENT AND PLAN:  1. CKD (chronic kidney disease),  stage IV (HCC) Currently the patient's noninvasive studies show that she has very good flow volume and no evidence of significant stenosis.  However, the patient does have swelling near the inner portion of her elbow and her fistula is not readily palpable.  Fortunately, patient has not yet on dialysis so there is time to leave it to mature.  I have instructed the patient to use a squeeze ball to help increase some flow into that arm.  Otherwise, we will have the patient follow-up in 1 month with no studies to evaluate the swelling as well as the maturity of the fistula.  2. Essential hypertension Continue antihypertensive medications as already ordered, these medications have been reviewed and there are no changes at this time.   3. Type 2 diabetes mellitus without complication, without long-term current use of insulin (HCC) Continue hypoglycemic medications as already ordered, these medications have been reviewed and there are no changes at this time.  Hgb A1C to be monitored as already arranged by primary service    Current Outpatient Medications on File Prior to Visit  Medication Sig Dispense Refill  . acetaminophen (TYLENOL) 500 MG tablet Take 500 mg by mouth every 6 (six) hours as needed.    Marland Kitchen allopurinol (ZYLOPRIM) 100 MG tablet Take 100 mg by mouth daily. Takes in the morning.    Marland Kitchen amLODipine (NORVASC) 10 MG tablet Take 10 mg by mouth daily. In the morning    . Cholecalciferol (VITAMIN D3) 2000 units capsule Take  2,000 Units by mouth daily.     . ferrous sulfate 325 (65 FE) MG tablet Take 325 mg by mouth daily with breakfast.     . fluticasone (FLONASE) 50 MCG/ACT nasal spray 2 sprays by Each Nare route daily.    . furosemide (LASIX) 20 MG tablet Take 20 mg by mouth daily.     Marland Kitchen glipiZIDE (GLUCOTROL XL) 10 MG 24 hr tablet Take by mouth. Patient has not taken in 6-7 months    . hydrALAZINE (APRESOLINE) 25 MG tablet Take 25 mg by mouth 3 (three) times daily.     . hydrochlorothiazide (HYDRODIURIL) 12.5 MG tablet Take 1 tablet (12.5 mg total) by mouth daily. 10 tablet 0  . isosorbide mononitrate (IMDUR) 30 MG 24 hr tablet Take 30 mg by mouth daily.    . metoprolol succinate (TOPROL-XL) 25 MG 24 hr tablet Take 25 mg by mouth daily.     . patiromer (VELTASSA) 8.4 g packet Take 8.4 g by mouth daily.     . rosuvastatin (CRESTOR) 20 MG tablet Take 20 mg by mouth daily. In the morning    . sodium bicarbonate 650 MG tablet Take 650 mg by mouth 2 (two) times daily.     No current facility-administered medications on file prior to visit.    There are no Patient Instructions on file for this visit. No follow-ups on file.   Kris Hartmann, NP  This note was completed with Sales executive.  Any errors are purely unintentional.

## 2019-10-06 ENCOUNTER — Encounter (INDEPENDENT_AMBULATORY_CARE_PROVIDER_SITE_OTHER): Payer: Self-pay | Admitting: Nurse Practitioner

## 2019-10-06 ENCOUNTER — Other Ambulatory Visit: Payer: Self-pay

## 2019-10-06 ENCOUNTER — Ambulatory Visit (INDEPENDENT_AMBULATORY_CARE_PROVIDER_SITE_OTHER): Payer: Medicare PPO | Admitting: Nurse Practitioner

## 2019-10-06 VITALS — BP 147/65 | HR 62 | Resp 15 | Wt 143.8 lb

## 2019-10-06 DIAGNOSIS — I10 Essential (primary) hypertension: Secondary | ICD-10-CM

## 2019-10-06 DIAGNOSIS — N185 Chronic kidney disease, stage 5: Secondary | ICD-10-CM

## 2019-10-08 ENCOUNTER — Encounter (INDEPENDENT_AMBULATORY_CARE_PROVIDER_SITE_OTHER): Payer: Self-pay | Admitting: Nurse Practitioner

## 2019-10-08 NOTE — Progress Notes (Signed)
SUBJECTIVE:  Patient ID: Shelby Galloway, female    DOB: 1945-12-26, 74 y.o.   MRN: [id number] Chief Complaint  Patient presents with  . Follow-up    1 month follow up    HPI  Shelby Galloway is a 74 y.o. female that presents today for reevaluation of her left brachiocephalic AV fistula.  Currently the patient is still not on dialysis with chronic kidney disease stage V.  She denies any uremic-like symptoms such as shortness of breath or any symptoms associated with fluid overload.  The patient returns today due to swelling located in the lateral portion of her arm following her surgery.  The swelling has gone down greatly.  It is still present but may be a third of the size that it was.  The patient surgical wound is completely healed with no issues.  Overall the patient states that she is doing well.  She denies any numbness, tingling or coldness of her fingertips.  Past Medical History:  Diagnosis Date  . Arthritis   . Diabetes mellitus without complication ([place])   . Gout   . Hypertension   . Renal disorder    stage 5 ckd    Past Surgical History:  Procedure Laterality Date  . ABDOMINAL HYSTERECTOMY    . AV FISTULA PLACEMENT Left 08/04/2019   Procedure: INSERTION OF ARTERIOVENOUS (AV) GORE-TEX GRAFT ARM;  Surgeon: Algernon Huxley, MD;  Location: [facility] ORS;  Service: Vascular;  Laterality: Left;    Social History   Socioeconomic History  . Marital status: Married    Spouse name: raymond  . Number of children: Not on file  . Years of education: Not on file  . Highest education level: Not on file  Occupational History  . 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 Smoking status: Former Smoker    Packs/day: 1.00    Types: Cigarettes    Quit date: 2010    Years since quitting: 11.2  . Smokeless tobacco: Never Used  Substance and Sexual Activity  . Alcohol use: No  . Drug use: Never  . Sexual activity: Not Currently    Other Topics Concern  . Not on file  Social History Narrative  . Not on file   Social Determinants of Health   Financial Resource Strain:   . Difficulty of Paying Living Expenses:   Food Insecurity:   . Worried About Charity fundraiser in the Last Year:   . Arboriculturist in the Last Year:   Transportation Needs:   . Film/video editor (Medical):   Marland Kitchen Lack of Transportation (Non-Medical):   Physical Activity:   . Days of Exercise per Week:   . Minutes of Exercise per Session:   Stress:   . Feeling of Stress :   Social Connections:   . Frequency of Communication with Friends and Family:   . Frequency of Social Gatherings with Friends and Family:   . Attends Religious Services:   . Active Member of Clubs or Organizations:   . Attends Archivist Meetings:   Marland Kitchen Marital Status:   Intimate Partner Violence:   . Fear of Current or Ex-Partner:   . Emotionally Abused:   Marland Kitchen Physically Abused:   . Sexually Abused:     Family History  Problem Relation Age of Onset  . Breast cancer Neg Hx     Allergies  Allergen Reactions  . Losartan  States worsened gout  . Ace Inhibitors     Sinus issues  . Atorvastatin     Sinus side effects  . Chlorthalidone     Sinus issues  . Codeine Other (See Comments)    States she feels "funny " in the head     Review of Systems   Review of Systems: Negative Unless Checked Constitutional: [] Weight loss  [] Fever  [] Chills Cardiac: [] Chest pain   []  Atrial Fibrillation  [x] Palpitations   [] Shortness of breath when laying flat   [] Shortness of breath with exertion. [] Shortness of breath at rest Vascular:  [] Pain in legs with walking   [] Pain in legs with standing [] Pain in legs when laying flat   [] Claudication    [] Pain in feet when laying flat    [] History of DVT   [] Phlebitis   [] Swelling in legs   [] Varicose veins   [] Non-healing ulcers Pulmonary:   [] Uses home oxygen   [] Productive cough   [] Hemoptysis   [] Wheeze  [] COPD    [] Asthma Neurologic:  [] Dizziness   [] Seizures  [] Blackouts [] History of stroke   [] History of TIA  [] Aphasia   [] Temporary Blindness   [] Weakness or numbness in arm   [] Weakness or numbness in leg Musculoskeletal:   [] Joint swelling   [] Joint pain   [] Low back pain  []  History of Knee Replacement [x] Arthritis [] back Surgeries  []  Spinal Stenosis    Hematologic:  [] Easy bruising  [] Easy bleeding   [] Hypercoagulable state   [x] Anemic Gastrointestinal:  [] Diarrhea   [] Vomiting  [] Gastroesophageal reflux/heartburn   [] Difficulty swallowing. [] Abdominal pain Genitourinary:  [x] Chronic kidney disease   [] Difficult urination  [] Anuric   [] Blood in urine [] Frequent urination  [] Burning with urination   [] Hematuria Skin:  [] Rashes   [] Ulcers [] Wounds Psychological:  [] History of anxiety   []  History of major depression  []  Memory Difficulties      OBJECTIVE:   Physical Exam  BP (!) 147/65 (BP Location: Right Arm)   Pulse 62   Resp 15   Wt 143 lb 12.8 oz (65.2 kg)   BMI 23.93 kg/m   Gen: WD/WN, NAD Head: [place]/AT, No temporalis wasting.  Ear/Nose/Throat: Hearing grossly intact, nares w/o erythema or drainage Eyes: PER, EOMI, sclera nonicteric.  Neck: Supple, no masses.  No JVD.  Pulmonary:  Good air movement, no use of accessory muscles.  Cardiac: RRR Vascular:  Good thrill and bruit Vessel Right Left  Radial Palpable Palpable   Gastrointestinal: soft, non-distended. No guarding/no peritoneal signs.  Musculoskeletal: M/S 5/5 throughout.  No deformity or atrophy.  Neurologic: Pain and light touch intact in extremities.  Symmetrical.  Speech is fluent. Motor exam as listed above. Psychiatric: Judgment intact, Mood & affect appropriate for pt's clinical situation. Dermatologic: No Venous rashes. No Ulcers Noted.  No changes consistent with cellulitis. Lymph : No Cervical lymphadenopathy, no lichenification or skin changes of chronic lymphedema.       ASSESSMENT AND PLAN:  1. CKD (chronic  kidney disease), stage V ([place]) Currently the patient's access looks good.  The patient is still not yet on dialysis.  If the patient were to need to start dialysis tomorrow she would be able to utilize her AV fistula.  However, we will have the patient return in 6 months for noninvasive studies.  The patient is advised that if something should change she should contact her office or if she needs to start dialysis sooner and there are concerns. - VAS US DUPLEX DIALYSIS ACCESS (AVF, AVG); Future  2. Essential hypertension Continue antihypertensive medications as already ordered, these medications have been reviewed and there are no changes at this time.    Current Outpatient Medications on File Prior to Visit  Medication Sig Dispense Refill  . acetaminophen (TYLENOL) 500 MG tablet Take 500 mg by mouth every 6 (six) hours as needed.    Marland Kitchen allopurinol (ZYLOPRIM) 100 MG tablet Take 100 mg by mouth daily. Takes in the morning.    Marland Kitchen amLODipine (NORVASC) 10 MG tablet Take 10 mg by mouth daily. In the morning    . Cholecalciferol (VITAMIN D3) 2000 units capsule Take 2,000 Units by mouth daily.     . ferrous sulfate 325 (65 FE) MG tablet Take 325 mg by mouth daily with breakfast.     . fluticasone (FLONASE) 50 MCG/ACT nasal spray 2 sprays by Each Nare route daily.    . furosemide (LASIX) 20 MG tablet Take 20 mg by mouth daily.     Marland Kitchen glipiZIDE (GLUCOTROL XL) 10 MG 24 hr tablet Take by mouth. Patient has not taken in 6-7 months    . hydrALAZINE (APRESOLINE) 25 MG tablet Take 25 mg by mouth 3 (three) times daily.     . hydrochlorothiazide (HYDRODIURIL) 12.5 MG tablet Take 1 tablet (12.5 mg total) by mouth daily. 10 tablet 0  . isosorbide mononitrate (IMDUR) 30 MG 24 hr tablet Take 30 mg by mouth daily.    . metoprolol succinate (TOPROL-XL) 25 MG 24 hr tablet Take 25 mg by mouth daily.     . patiromer (VELTASSA) 8.4 g packet Take 8.4 g by mouth daily.     . rosuvastatin (CRESTOR) 20 MG tablet Take 20 mg by  mouth daily. In the morning    . sodium bicarbonate 650 MG tablet Take 650 mg by mouth 2 (two) times daily.     No current facility-administered medications on file prior to visit.    There are no Patient Instructions on file for this visit. No follow-ups on file.   Kris Hartmann, NP  This note was completed with Sales executive.  Any errors are purely unintentional.

## 2019-12-02 ENCOUNTER — Other Ambulatory Visit: Payer: Self-pay | Admitting: Student

## 2019-12-02 DIAGNOSIS — Z1231 Encounter for screening mammogram for malignant neoplasm of breast: Secondary | ICD-10-CM

## 2019-12-07 ENCOUNTER — Ambulatory Visit
Admission: RE | Admit: 2019-12-07 | Discharge: 2019-12-07 | Disposition: A | Discharge status: Home / Self Care | Payer: Medicare PPO | Source: Ambulatory Visit | Attending: Student | Admitting: Student

## 2019-12-07 ENCOUNTER — Other Ambulatory Visit: Payer: Self-pay

## 2019-12-07 DIAGNOSIS — Z1231 Encounter for screening mammogram for malignant neoplasm of breast: Secondary | ICD-10-CM | POA: Diagnosis present

## 2020-04-11 ENCOUNTER — Other Ambulatory Visit: Payer: Self-pay

## 2020-04-11 ENCOUNTER — Ambulatory Visit (INDEPENDENT_AMBULATORY_CARE_PROVIDER_SITE_OTHER): Payer: Medicare PPO

## 2020-04-11 ENCOUNTER — Ambulatory Visit (INDEPENDENT_AMBULATORY_CARE_PROVIDER_SITE_OTHER): Payer: Medicare PPO | Admitting: Vascular Surgery

## 2020-04-11 DIAGNOSIS — N185 Chronic kidney disease, stage 5: Secondary | ICD-10-CM

## 2020-04-13 DIAGNOSIS — T782XXA Anaphylactic shock, unspecified, initial encounter: Secondary | ICD-10-CM | POA: Insufficient documentation

## 2020-04-13 DIAGNOSIS — D509 Iron deficiency anemia, unspecified: Secondary | ICD-10-CM | POA: Insufficient documentation

## 2020-04-13 DIAGNOSIS — D631 Anemia in chronic kidney disease: Secondary | ICD-10-CM | POA: Insufficient documentation

## 2020-04-13 DIAGNOSIS — N189 Chronic kidney disease, unspecified: Secondary | ICD-10-CM | POA: Insufficient documentation

## 2020-04-13 DIAGNOSIS — M109 Gout, unspecified: Secondary | ICD-10-CM | POA: Insufficient documentation

## 2020-04-13 DIAGNOSIS — D689 Coagulation defect, unspecified: Secondary | ICD-10-CM | POA: Insufficient documentation

## 2020-04-13 DIAGNOSIS — Z992 Dependence on renal dialysis: Secondary | ICD-10-CM | POA: Insufficient documentation

## 2020-04-13 DIAGNOSIS — N186 End stage renal disease: Secondary | ICD-10-CM | POA: Insufficient documentation

## 2020-04-13 DIAGNOSIS — E8809 Other disorders of plasma-protein metabolism, not elsewhere classified: Secondary | ICD-10-CM | POA: Insufficient documentation

## 2020-04-13 DIAGNOSIS — T7840XA Allergy, unspecified, initial encounter: Secondary | ICD-10-CM | POA: Insufficient documentation

## 2020-04-13 DIAGNOSIS — N2581 Secondary hyperparathyroidism of renal origin: Secondary | ICD-10-CM | POA: Insufficient documentation

## 2020-04-18 ENCOUNTER — Telehealth (INDEPENDENT_AMBULATORY_CARE_PROVIDER_SITE_OTHER): Payer: Self-pay

## 2020-04-18 NOTE — Telephone Encounter (Signed)
I attempted to contact the patient and a message was left for a return call. 

## 2020-04-18 NOTE — Telephone Encounter (Signed)
Patient returned my call and is now scheduled with Dr. Lucky Cowboy for a left arm fistulagram on 04/24/20 with a 7:45 am arrival time to the [facility]. Covid testing on 04/20/20 between 8-1 pm at the [facility]. Pre-procedure instructions were discussed and will be mailed.

## 2020-04-20 ENCOUNTER — Other Ambulatory Visit: Payer: Self-pay

## 2020-04-20 ENCOUNTER — Other Ambulatory Visit
Admission: RE | Admit: 2020-04-20 | Discharge: 2020-04-20 | Disposition: A | Discharge status: Home / Self Care | Payer: Medicare PPO | Source: Ambulatory Visit | Attending: Vascular Surgery | Admitting: Vascular Surgery

## 2020-04-20 DIAGNOSIS — Z20822 Contact with and (suspected) exposure to covid-19: Secondary | ICD-10-CM | POA: Diagnosis not present

## 2020-04-20 DIAGNOSIS — Z01812 Encounter for preprocedural laboratory examination: Secondary | ICD-10-CM | POA: Diagnosis present

## 2020-04-20 LAB — SARS CORONAVIRUS 2 (TAT 6-24 HRS): SARS Coronavirus 2: NEGATIVE

## 2020-04-21 ENCOUNTER — Other Ambulatory Visit (INDEPENDENT_AMBULATORY_CARE_PROVIDER_SITE_OTHER): Payer: Self-pay | Admitting: Nurse Practitioner

## 2020-04-24 ENCOUNTER — Encounter: Payer: Self-pay | Admitting: Vascular Surgery

## 2020-04-24 ENCOUNTER — Encounter
Admission: RE | Disposition: A | Discharge status: Home / Self Care | Payer: Self-pay | Source: Home / Self Care | Attending: Vascular Surgery

## 2020-04-24 ENCOUNTER — Other Ambulatory Visit: Payer: Self-pay

## 2020-04-24 ENCOUNTER — Ambulatory Visit
Admission: RE | Admit: 2020-04-24 | Discharge: 2020-04-24 | Disposition: A | Discharge status: Home / Self Care | Payer: Medicare PPO | Attending: Vascular Surgery | Admitting: Vascular Surgery

## 2020-04-24 DIAGNOSIS — Y841 Kidney dialysis as the cause of abnormal reaction of the patient, or of later complication, without mention of misadventure at the time of the procedure: Secondary | ICD-10-CM | POA: Insufficient documentation

## 2020-04-24 DIAGNOSIS — T82858A Stenosis of vascular prosthetic devices, implants and grafts, initial encounter: Secondary | ICD-10-CM | POA: Insufficient documentation

## 2020-04-24 DIAGNOSIS — M109 Gout, unspecified: Secondary | ICD-10-CM | POA: Insufficient documentation

## 2020-04-24 DIAGNOSIS — Z87891 Personal history of nicotine dependence: Secondary | ICD-10-CM | POA: Insufficient documentation

## 2020-04-24 DIAGNOSIS — Z885 Allergy status to narcotic agent status: Secondary | ICD-10-CM | POA: Insufficient documentation

## 2020-04-24 DIAGNOSIS — I120 Hypertensive chronic kidney disease with stage 5 chronic kidney disease or end stage renal disease: Secondary | ICD-10-CM | POA: Diagnosis not present

## 2020-04-24 DIAGNOSIS — Z992 Dependence on renal dialysis: Secondary | ICD-10-CM | POA: Diagnosis not present

## 2020-04-24 DIAGNOSIS — T82898A Other specified complication of vascular prosthetic devices, implants and grafts, initial encounter: Secondary | ICD-10-CM | POA: Diagnosis not present

## 2020-04-24 DIAGNOSIS — E1122 Type 2 diabetes mellitus with diabetic chronic kidney disease: Secondary | ICD-10-CM | POA: Diagnosis not present

## 2020-04-24 DIAGNOSIS — N186 End stage renal disease: Secondary | ICD-10-CM

## 2020-04-24 DIAGNOSIS — I2510 Atherosclerotic heart disease of native coronary artery without angina pectoris: Secondary | ICD-10-CM | POA: Diagnosis not present

## 2020-04-24 HISTORY — PX: A/V FISTULAGRAM: CATH118298

## 2020-04-24 LAB — GLUCOSE, CAPILLARY
Glucose-Capillary: 97 mg/dL (ref 70–99)
Glucose-Capillary: 88 mg/dL (ref 70–99)

## 2020-04-24 LAB — POTASSIUM (ARMC VASCULAR LAB ONLY): Potassium (ARMC vascular lab): 6.1 — ABNORMAL HIGH (ref 3.5–5.1)

## 2020-04-24 LAB — POTASSIUM: Potassium: 6.1 mmol/L — ABNORMAL HIGH (ref 3.5–5.1)

## 2020-04-24 SURGERY — A/V FISTULAGRAM
Anesthesia: Moderate Sedation | Laterality: Left

## 2020-04-24 MED ORDER — MIDAZOLAM HCL 5 MG/5ML IJ SOLN
INTRAMUSCULAR | Status: AC
  Filled 2020-04-24: qty 5

## 2020-04-24 MED ORDER — FENTANYL CITRATE (PF) 100 MCG/2ML IJ SOLN: 12.5000 ug | Freq: Once | INTRAMUSCULAR | Status: DC | PRN

## 2020-04-24 MED ORDER — IODIXANOL 320 MG/ML IV SOLN
INTRAVENOUS | Status: DC | PRN
  Administered 2020-04-24: 25 mL via INTRAVENOUS

## 2020-04-24 MED ORDER — CEFAZOLIN SODIUM-DEXTROSE 1-4 GM/50ML-% IV SOLN
1.0000 g | Freq: Once | INTRAVENOUS | Status: AC
  Administered 2020-04-24: 1 g via INTRAVENOUS

## 2020-04-24 MED ORDER — DIPHENHYDRAMINE HCL 50 MG/ML IJ SOLN: 50.0000 mg | Freq: Once | INTRAMUSCULAR | Status: DC | PRN

## 2020-04-24 MED ORDER — ONDANSETRON HCL 4 MG/2ML IJ SOLN: 4.0000 mg | Freq: Four times a day (QID) | INTRAMUSCULAR | Status: DC | PRN

## 2020-04-24 MED ORDER — METHYLPREDNISOLONE SODIUM SUCC 125 MG IJ SOLR: 125.0000 mg | Freq: Once | INTRAMUSCULAR | Status: DC | PRN

## 2020-04-24 MED ORDER — FENTANYL CITRATE (PF) 100 MCG/2ML IJ SOLN
INTRAMUSCULAR | Status: DC
Start: 2020-04-24 — End: 2020-04-24
  Filled 2020-04-24: qty 2

## 2020-04-24 MED ORDER — HEPARIN SODIUM (PORCINE) 1000 UNIT/ML IJ SOLN
INTRAMUSCULAR | Status: DC | PRN
  Administered 2020-04-24: 3000 [IU] via INTRAVENOUS

## 2020-04-24 MED ORDER — MIDAZOLAM HCL 2 MG/2ML IJ SOLN
INTRAMUSCULAR | Status: DC | PRN
  Administered 2020-04-24: 2 mg via INTRAVENOUS
  Administered 2020-04-24: 1 mg via INTRAVENOUS

## 2020-04-24 MED ORDER — FENTANYL CITRATE (PF) 100 MCG/2ML IJ SOLN
INTRAMUSCULAR | Status: DC | PRN
Start: 2020-04-24 — End: 2020-04-24
  Administered 2020-04-24: 25 ug via INTRAVENOUS
  Administered 2020-04-24: 50 ug via INTRAVENOUS

## 2020-04-24 MED ORDER — FAMOTIDINE 20 MG PO TABS: 40.0000 mg | ORAL_TABLET | Freq: Once | ORAL | Status: DC | PRN

## 2020-04-24 MED ORDER — SODIUM CHLORIDE 0.9 % IV SOLN: INTRAVENOUS | Status: DC

## 2020-04-24 MED ORDER — MIDAZOLAM HCL 2 MG/ML PO SYRP: 8.0000 mg | ORAL_SOLUTION | Freq: Once | ORAL | Status: DC | PRN

## 2020-04-24 MED ORDER — HEPARIN SODIUM (PORCINE) 1000 UNIT/ML IJ SOLN
INTRAMUSCULAR | Status: AC
  Filled 2020-04-24: qty 1

## 2020-04-24 SURGICAL SUPPLY — 10 items
BALLN LUTONIX 7X220X130 (BALLOONS) ×2
BALLOON LUTONIX 7X220X130 (BALLOONS) ×1 IMPLANT
CANNULA 5F STIFF (CANNULA) ×2 IMPLANT
DRAPE BRACHIAL (DRAPES) ×2 IMPLANT
KIT ENCORE 26 ADVANTAGE (KITS) ×2 IMPLANT
PACK ANGIOGRAPHY (CUSTOM PROCEDURE TRAY) ×2 IMPLANT
SHEATH BRITE TIP 6FRX5.5 (SHEATH) ×2 IMPLANT
SUT MNCRL AB 4-0 PS2 18 (SUTURE) ×2 IMPLANT
TOWEL OR 17X26 4PK STRL BLUE (TOWEL DISPOSABLE) ×2 IMPLANT
WIRE MAGIC TOR.035 180C (WIRE) ×2 IMPLANT

## 2020-04-24 NOTE — Op Note (Signed)
VEIN AND VASCULAR SURGERY    OPERATIVE NOTE   PROCEDURE: 1.   Left brachiocephalic arteriovenous fistula cannulation under ultrasound guidance 2.   Left arm fistulagram including central venogram 3.   Percutaneous transluminal angioplasty of left upper arm cephalic vein with 7 mm diameter by 22 cm length Lutonix drug-coated angioplasty balloon  PRE-OPERATIVE DIAGNOSIS: 1. ESRD 2. Poorly functional left brachiocephalic AVF  POST-OPERATIVE DIAGNOSIS: same as above   SURGEON: Leotis Pain, MD  ANESTHESIA: local with MCS  ESTIMATED BLOOD LOSS: 5 cc  FINDING(S): 1. Multiple areas of narrowing in the left upper arm cephalic vein from the shoulder to the mid to distal upper arm.  Several of these were greater than 60% and 2 were greater than 80%.  The remainder of the central venous circulation was widely patent.  SPECIMEN(S):  None  CONTRAST: 25 cc  FLUORO TIME: 1.5 minutes  MODERATE CONSCIOUS SEDATION TIME: Approximately 22 minutes with 3 mg of Versed and 75 mcg of Fentanyl   INDICATIONS: Shelby Galloway is a 74 y.o. female who presents with malfunctioning left brachiocephalic arteriovenous fistula.  The patient is scheduled for left arm fistulagram.  The patient is aware the risks include but are not limited to: bleeding, infection, thrombosis of the cannulated access, and possible anaphylactic reaction to the contrast.  The patient is aware of the risks of the procedure and elects to proceed forward.  DESCRIPTION: After full informed written consent was obtained, the patient was brought back to the angiography suite and placed supine upon the angiography table.  The patient was connected to monitoring equipment. Moderate conscious sedation was administered with a face to face encounter with the patient throughout the procedure with my supervision of the RN administering medicines and monitoring the patient's vital signs and mental status throughout from the start of the  procedure until the patient was taken to the recovery room. The left arm was prepped and draped in the standard fashion for a percutaneous access intervention.  Under ultrasound guidance, the left brachiocephalic arteriovenous fistula was cannulated with a micropuncture needle under direct ultrasound guidance where it was patent and a permanent image was performed.  The microwire was advanced into the fistula and the needle was exchanged for the a microsheath.  I then upsized to a 6 Fr Sheath and imaging was performed.  Hand injections were completed to image the access including the central venous system. This demonstrated multiple areas of narrowing in the left upper arm cephalic vein from the shoulder to the mid to distal upper arm.  Several of these were greater than 60% and 2 were greater than 80%.  The remainder of the central venous circulation was widely patent.  Based on the images, this patient will need intervention to improve the cephalic vein in the upper arm. I then gave the patient 3000 units of intravenous heparin.  I then crossed the stenosis with a Magic Tourqe wire.  Based on the imaging, a 7 mm x 22 cm Lutonix drug-coated angioplasty balloon was selected.  The balloon was centered around the multiple areas of the left upper arm cephalic vein stenosis and inflated to 8 ATM for 1 minute(s).  On completion imaging, a markedly improved flow within the fistula with no greater than 25% residual stenosis was present.     Based on the completion imaging, no further intervention is necessary.  The wire and balloon were removed from the sheath.  A 4-0 Monocryl purse-string suture was sewn around the sheath.  The sheath was removed while tying down the suture.  A sterile bandage was applied to the puncture site.  COMPLICATIONS: None  CONDITION: Stable   Leotis Pain  04/24/2020 11:01 AM   This note was created with Dragon Medical transcription system. Any errors in dictation are purely  unintentional.

## 2020-04-24 NOTE — H&P (Signed)
[facility] Admission History & Physical  MRN : [id number]  Shelby Galloway is a 74 y.o. ([birth year]) female who presents with chief complaint of No chief complaint on file. Marland Kitchen  History of Present Illness: I am asked to evaluate the patient by the dialysis center. The patient was sent here because they were unable to achieve adequate dialysis this morning. Furthermore the Center states there is very poor thrill and bruit. The patient states there there have been increasing problems with the access, such as "pulling clots" during dialysis and prolonged bleeding after decannulation. The patient estimates these problems have been going on for several weeks. The patient is unaware of any other change.  Patient denies pain or tenderness overlying the access.  There is no pain with dialysis.  The patient denies hand pain or finger pain consistent with steal syndrome.   There have been no recent interventions or declots of this access.  The patient is not chronically hypotensive on dialysis.  No current facility-administered medications for this encounter.    Past Medical History:  Diagnosis Date  . Arthritis   . Diabetes mellitus without complication ([place])   . Gout   . Hypertension   . Renal disorder    stage 5 ckd    Past Surgical History:  Procedure Laterality Date  . ABDOMINAL HYSTERECTOMY    . AV FISTULA PLACEMENT Left 08/04/2019   Procedure: INSERTION OF ARTERIOVENOUS (AV) GORE-TEX GRAFT ARM;  Surgeon: Algernon Huxley, MD;  Location: [facility] ORS;  Service: Vascular;  Laterality: Left;     Social History   Tobacco Use  . Smoking status: Former Smoker    Packs/day: 1.00    Types: Cigarettes    Quit date: 2010    Years since quitting: 11.7  . Smokeless tobacco: Never Used  Vaping Use  . Vaping Use: Never used  Substance Use Topics  . Alcohol use: No  . Drug use: Never     Family History  Problem Relation Age of Onset  . Breast cancer Neg Hx      No family history of bleeding or clotting disorders, autoimmune disease or porphyria  Allergies  Allergen Reactions  . Losartan     States worsened gout  . Ace Inhibitors     Sinus issues  . Atorvastatin     Sinus side effects  . Chlorthalidone     Sinus issues  . Codeine Other (See Comments)    States she feels "funny " in the head     REVIEW OF SYSTEMS (Negative unless checked)  Constitutional: [] Weight loss  [] Fever  [] Chills Cardiac: [] Chest pain   [] Chest pressure   [] Palpitations   [] Shortness of breath when laying flat   [] Shortness of breath at rest   [x] Shortness of breath with exertion. Vascular:  [] Pain in legs with walking   [] Pain in legs at rest   [] Pain in legs when laying flat   [] Claudication   [] Pain in feet when walking  [] Pain in feet at rest  [] Pain in feet when laying flat   [] History of DVT   [] Phlebitis   [] Swelling in legs   [] Varicose veins   [] Non-healing ulcers Pulmonary:   [] Uses home oxygen   [] Productive cough   [] Hemoptysis   [] Wheeze  [] COPD   [] Asthma Neurologic:  [] Dizziness  [] Blackouts   [] Seizures   [] History of stroke   [] History of TIA  [] Aphasia   [] Temporary blindness   [] Dysphagia   [] Weakness or numbness  in arms   [] Weakness or numbness in legs Musculoskeletal:  [] Arthritis   [] Joint swelling   [] Joint pain   [] Low back pain Hematologic:  [] Easy bruising  [] Easy bleeding   [] Hypercoagulable state   [x] Anemic  [] Hepatitis Gastrointestinal:  [] Blood in stool   [] Vomiting blood  [] Gastroesophageal reflux/heartburn   [] Difficulty swallowing. Genitourinary:  [x] Chronic kidney disease   [] Difficult urination  [] Frequent urination  [] Burning with urination   [] Blood in urine Skin:  [] Rashes   [] Ulcers   [] Wounds Psychological:  [] History of anxiety   []  History of major depression.  Physical Examination  There were no vitals filed for this visit. There is no height or weight on file to calculate BMI. Gen: WD/WN, NAD Head: [place]/AT, No temporalis  wasting.  Ear/Nose/Throat: Hearing grossly intact, nares w/o erythema or drainage, oropharynx w/o Erythema/Exudate,  Eyes: Conjunctiva clear, sclera non-icteric Neck: Trachea midline.  No JVD.  Pulmonary:  Good air movement, respirations not labored, no use of accessory muscles.  Cardiac: RRR, normal S1, S2. Vascular: decreased thrill arm AVF Vessel Right Left  Radial Palpable Palpable   Musculoskeletal: M/S 5/5 throughout.  Extremities without ischemic changes.  No deformity or atrophy.  Neurologic: Sensation grossly intact in extremities.  Symmetrical.  Speech is fluent. Motor exam as listed above. Psychiatric: Judgment intact, Mood & affect appropriate for pt's clinical situation. Dermatologic: No rashes or ulcers noted.  No cellulitis or open wounds.    CBC Lab Results  Component Value Date   WBC 6.6 08/02/2019   HGB 7.9 (L) 08/02/2019   HCT 24.3 (L) 08/02/2019   MCV 91.4 08/02/2019   PLT 249 08/02/2019    BMET    Component Value Date/Time   NA 140 08/02/2019 1258   K 3.1 (L) 08/02/2019 1258   CL 107 08/02/2019 1258   CO2 21 (L) 08/02/2019 1258   GLUCOSE 136 (H) 08/02/2019 1258   BUN 46 (H) 08/02/2019 1258   CREATININE 4.88 (H) 08/02/2019 1258   CALCIUM 9.2 08/02/2019 1258   GFRNONAA 8 (L) 08/02/2019 1258   GFRAA 10 (L) 08/02/2019 1258   CrCl cannot be calculated (Patient's most recent lab result is older than the maximum 21 days allowed.).  COAG Lab Results  Component Value Date   INR 1.1 08/02/2019    Radiology VAS [place] (AVF, AVG)  Result Date: 04/14/2020 DIALYSIS ACCESS Access Site: Left Upper Extremity. Access Type: Brachial-cephalic AVF. History: Created 08/04/2019. Comparison Study: 09/08/2019 Performing Technologist: Almira Coaster RVS  Examination Guidelines: A complete evaluation includes B-mode imaging, spectral Doppler, color Doppler, and power Doppler as needed of all accessible portions of each vessel. Unilateral testing is  considered an integral part of a complete examination. Limited examinations for reoccurring indications may be performed as noted.  Findings: +--------------------+----------+-----------------+--------+ AVF                 PSV (cm/s)Flow Vol (mL/min)Comments +--------------------+----------+-----------------+--------+ Native artery inflow   157           812                +--------------------+----------+-----------------+--------+ AVF Anastomosis        331                              +--------------------+----------+-----------------+--------+  +---------------+----------+-------------+----------+------------------+ OUTFLOW VEIN   PSV (cm/s)Diameter (cm)Depth (cm)     Describe      +---------------+----------+-------------+----------+------------------+ Subclavian vein   386                                              +---------------+----------+-------------+----------+------------------+  [place]        182                                              +---------------+----------+-------------+----------+------------------+ Shoulder          389        0.23                                  +---------------+----------+-------------+----------+------------------+ Prox UA           473        0.65               change in Diameter +---------------+----------+-------------+----------+------------------+ Mid UA            517        0.33               change in Diameter +---------------+----------+-------------+----------+------------------+ Dist UA           366        1.02               change in Diameter +---------------+----------+-------------+----------+------------------+  +--------------+-------------+---------+---------+----------+------------------+               Diameter (cm)  Depth  BranchingPSV (cm/s)   Flow Volume                                  (cm)                           (ml/min)       +--------------+-------------+---------+---------+----------+------------------+ Lt Rad Art                                       32                       Dist                                                                      +--------------+-------------+---------+---------+----------+------------------+  Summary: The Left Brachial Cephalic AVF appears to be patent throughout; flow Volume appears to be normal. Changes in diameter seen in the outflow vein with no evidence of internal stenosis but elevated Velocities in the Mid and Proximal segment.  *See table(s) above for measurements and observations.  Diagnosing physician: Leotis Pain MD Electronically signed by Leotis Pain MD on 04/14/2020 at 8:14:07 AM.   --------------------------------------------------------------------------------   Final     Assessment/Plan 1.  Complication dialysis device AV access:  Patient's dialysis access is malfunctioning. The patient will undergo angiography and correction of any problems using interventional techniques with the hope of restoring function to the access.  The risks and benefits were described to the patient.  All questions were answered.  The patient agrees to proceed with angiography and intervention. Potassium will be drawn to ensure that  it is an appropriate level prior to performing intervention. 2.  End-stage renal disease requiring hemodialysis:  Patient will continue dialysis therapy without further interruption if a successful intervention is not achieved then a tunneled catheter will be placed. Dialysis has already been arranged. 3.  Hypertension:  Patient will continue medical management; nephrology is following no changes in oral medications. 4. Diabetes mellitus:  Glucose will be monitored and oral medications been held this morning once the patient has undergone the patient's procedure po intake will be reinitiated and again Accu-Cheks will be used to assess the blood glucose level and  treat as needed. The patient will be restarted on the patient's usual hypoglycemic regime 5.  Coronary artery disease:  EKG will be monitored. Nitrates will be used if needed. The patient's oral cardiac medications will be continued.    Leotis Pain, MD  04/24/2020 8:50 AM

## 2020-04-24 NOTE — Progress Notes (Addendum)
Informed patient potassium level is 6.1 today and to call nephrology clinic today to inform MD of lab results.  Pt aware fistula can be used per Dr. Lucky Cowboy.

## 2020-04-25 ENCOUNTER — Encounter: Payer: Self-pay | Admitting: Vascular Surgery

## 2020-05-02 DIAGNOSIS — E441 Mild protein-calorie malnutrition: Secondary | ICD-10-CM | POA: Insufficient documentation

## 2020-05-20 DIAGNOSIS — Z23 Encounter for immunization: Secondary | ICD-10-CM | POA: Insufficient documentation

## 2020-05-23 ENCOUNTER — Encounter (INDEPENDENT_AMBULATORY_CARE_PROVIDER_SITE_OTHER): Payer: Medicare PPO

## 2020-05-23 ENCOUNTER — Ambulatory Visit (INDEPENDENT_AMBULATORY_CARE_PROVIDER_SITE_OTHER): Payer: Medicare PPO | Admitting: Nurse Practitioner

## 2020-06-01 ENCOUNTER — Other Ambulatory Visit (INDEPENDENT_AMBULATORY_CARE_PROVIDER_SITE_OTHER): Payer: Self-pay | Admitting: Vascular Surgery

## 2020-06-01 DIAGNOSIS — Z9862 Peripheral vascular angioplasty status: Secondary | ICD-10-CM

## 2020-06-01 DIAGNOSIS — T829XXS Unspecified complication of cardiac and vascular prosthetic device, implant and graft, sequela: Secondary | ICD-10-CM

## 2020-06-02 ENCOUNTER — Other Ambulatory Visit: Payer: Self-pay

## 2020-06-02 ENCOUNTER — Ambulatory Visit (INDEPENDENT_AMBULATORY_CARE_PROVIDER_SITE_OTHER): Payer: Medicare PPO | Admitting: Nurse Practitioner

## 2020-06-02 ENCOUNTER — Ambulatory Visit (INDEPENDENT_AMBULATORY_CARE_PROVIDER_SITE_OTHER): Payer: Medicare PPO

## 2020-06-02 ENCOUNTER — Encounter (INDEPENDENT_AMBULATORY_CARE_PROVIDER_SITE_OTHER): Payer: Self-pay | Admitting: Nurse Practitioner

## 2020-06-02 VITALS — BP 123/64 | HR 67 | Resp 16 | Wt 130.6 lb

## 2020-06-02 DIAGNOSIS — T829XXS Unspecified complication of cardiac and vascular prosthetic device, implant and graft, sequela: Secondary | ICD-10-CM | POA: Diagnosis not present

## 2020-06-02 DIAGNOSIS — N186 End stage renal disease: Secondary | ICD-10-CM

## 2020-06-02 DIAGNOSIS — Z9862 Peripheral vascular angioplasty status: Secondary | ICD-10-CM

## 2020-06-02 DIAGNOSIS — E119 Type 2 diabetes mellitus without complications: Secondary | ICD-10-CM

## 2020-06-02 DIAGNOSIS — I10 Essential (primary) hypertension: Secondary | ICD-10-CM | POA: Diagnosis not present

## 2020-06-04 ENCOUNTER — Encounter (INDEPENDENT_AMBULATORY_CARE_PROVIDER_SITE_OTHER): Payer: Self-pay | Admitting: Nurse Practitioner

## 2020-06-04 NOTE — Progress Notes (Signed)
Subjective:    Patient ID: Shelby Galloway, female    DOB: 01/08/1946, 74 y.o.   MRN: [id number] Chief Complaint  Patient presents with  . Follow-up    [facility] a/v fistulagram    The patient returns to the office for followup status post intervention of the dialysis access left brachiocephalic AV fistula. Following the intervention the access function has significantly improved, with better flow rates and improved KT/V. The patient has not been experiencing increased bleeding times following decannulation and the patient denies increased recirculation. The patient denies an increase in arm swelling. At the present time the patient denies hand pain.  The patient notes that she started dialysis around the end of September.  The patient denies amaurosis fugax or recent TIA symptoms. There are no recent neurological changes noted. The patient denies claudication symptoms or rest pain symptoms. The patient denies history of DVT, PE or superficial thrombophlebitis. The patient denies recent episodes of angina or shortness of breath.    Patient has a flow volume of 1877.  Post angioplasty cephalic vein there appears to be good flow no evidence of stenosis.     Review of Systems  Cardiovascular: Negative for leg swelling.  Hematological: Bruises/bleeds easily.  All other systems reviewed and are negative.      Objective:   Physical Exam Vitals reviewed.  HENT:     Head: Normocephalic.  Cardiovascular:     Rate and Rhythm: Normal rate.     Pulses: Normal pulses.     Arteriovenous access: left arteriovenous access is present.    Comments: Good thrill and bruit left brachiocephalic fistula Pulmonary:     Effort: Pulmonary effort is normal.  Neurological:     Mental Status: She is alert and oriented to person, place, and time.  Psychiatric:        Mood and Affect: Mood normal.        Behavior: Behavior normal.        Thought Content: Thought content normal.        Judgment:  Judgment normal.     BP 123/64 (BP Location: Right Arm)   Pulse 67   Resp 16   Wt 130 lb 9.6 oz (59.2 kg)   BMI 21.08 kg/m   Past Medical History:  Diagnosis Date  . Arthritis   . Diabetes mellitus without complication ([place])   . Gout   . Hypertension   . Renal disorder    stage 5 ckd    Social History   Socioeconomic History  . Marital status: Married    Spouse name: raymond  . Number of children: Not on file  . Years of education: Not on file  . Highest education level: Not on file  Occupational History  . 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 Smoking status: Former Smoker    Packs/day: 1.00    Types: Cigarettes    Quit date: 2010    Years since quitting: 11.8  . Smokeless tobacco: Never Used  Vaping Use  . Vaping Use: Never used  Substance and Sexual Activity  . Alcohol use: No  . Drug use: Never  . Sexual activity: Not Currently  Other Topics Concern  . Not on file  Social History Narrative  . Not on file   Social Determinants of Health   Financial Resource Strain:   . Difficulty of Paying Living Expenses: Not on file  Food Insecurity:   .  Worried About Charity fundraiser in the Last Year: Not on file  . Ran Out of Food in the Last Year: Not on file  Transportation Needs:   . Lack of Transportation (Medical): Not on file  . Lack of Transportation (Non-Medical): Not on file  Physical Activity:   . Days of Exercise per Week: Not on file  . Minutes of Exercise per Session: Not on file  Stress:   . Feeling of Stress : Not on file  Social Connections:   . Frequency of Communication with Friends and Family: Not on file  . Frequency of Social Gatherings with Friends and Family: Not on file  . Attends Religious Services: Not on file  . Active Member of Clubs or Organizations: Not on file  . Attends Archivist Meetings: Not on file  . Marital Status: Not on file  Intimate Partner Violence:     . Fear of Current or Ex-Partner: Not on file  . Emotionally Abused: Not on file  . Physically Abused: Not on file  . Sexually Abused: Not on file    Past Surgical History:  Procedure Laterality Date  . A/V FISTULAGRAM Left 04/24/2020   Procedure: A/V FISTULAGRAM;  Surgeon: Algernon Huxley, MD;  Location: [place] [facility];  Service: Cardiovascular;  Laterality: Left;  . ABDOMINAL HYSTERECTOMY    . AV FISTULA PLACEMENT Left 08/04/2019   Procedure: INSERTION OF ARTERIOVENOUS (AV) GORE-TEX GRAFT ARM;  Surgeon: Algernon Huxley, MD;  Location: [facility];  Service: Vascular;  Laterality: Left;    Family History  Problem Relation Age of Onset  . Breast cancer Neg Hx     Allergies  Allergen Reactions  . Losartan     States worsened gout  . Ace Inhibitors     Sinus issues  . Atorvastatin     Sinus side effects  . Chlorthalidone     Sinus issues  . Codeine Other (See Comments)    States she feels "funny " in the head    CBC Latest Ref Rng & Units 08/02/2019 01/16/2016  WBC 4.0 - 10.5 K/uL 6.6 9.5  Hemoglobin 12.0 - 15.0 g/dL 7.9(L) 8.0(L)  Hematocrit 36 - 46 % 24.3(L) 24.1(L)  Platelets 150 - 400 K/uL 249 298      CMP     Component Value Date/Time   NA 140 08/02/2019 1258   K 6.1 (H) 04/24/2020 0956   CL 107 08/02/2019 1258   CO2 21 (L) 08/02/2019 1258   GLUCOSE 136 (H) 08/02/2019 1258   BUN 46 (H) 08/02/2019 1258   CREATININE 4.88 (H) 08/02/2019 1258   CALCIUM 9.2 08/02/2019 1258   PROT 7.2 01/16/2016 1942   ALBUMIN 3.2 (L) 01/16/2016 1942   AST 13 (L) 01/16/2016 1942   ALT 8 (L) 01/16/2016 1942   ALKPHOS 67 01/16/2016 1942   BILITOT <0.1 (L) 01/16/2016 1942   GFRNONAA 8 (L) 08/02/2019 1258   GFRAA 10 (L) 08/02/2019 1258     No results found.     Assessment & Plan:   1. Complication of vascular access for dialysis, sequela Recommend:  The patient is doing well and currently has adequate dialysis access. The patient's dialysis center is not reporting any  access issues. Flow pattern is stable when compared to the prior ultrasound.  The patient should have a duplex ultrasound of the dialysis access in 6 months. The patient will follow-up with me in the office after each ultrasound     2. Primary  hypertension Continue antihypertensive medications as already ordered, these medications have been reviewed and there are no changes at this time.   3. Type 2 diabetes mellitus without complication, without long-term current use of insulin (HCC) Continue hypoglycemic medications as already ordered, these medications have been reviewed and there are no changes at this time.  Hgb A1C to be monitored as already arranged by primary service    Current Outpatient Medications on File Prior to Visit  Medication Sig Dispense Refill  . acetaminophen (TYLENOL) 500 MG tablet Take 500 mg by mouth every 6 (six) hours as needed.    Marland Kitchen allopurinol (ZYLOPRIM) 100 MG tablet Take 100 mg by mouth daily. Takes in the morning.    Marland Kitchen amLODipine (NORVASC) 10 MG tablet Take 10 mg by mouth daily. In the morning    . Cholecalciferol (VITAMIN D3) 2000 units capsule Take 2,000 Units by mouth daily.     . ferrous sulfate 325 (65 FE) MG tablet Take 325 mg by mouth daily with breakfast.     . fluticasone (FLONASE) 50 MCG/ACT nasal spray 2 sprays by Each Nare route daily.    . furosemide (LASIX) 20 MG tablet Take 20 mg by mouth daily.     Marland Kitchen glipiZIDE (GLUCOTROL XL) 10 MG 24 hr tablet Take by mouth. Patient has not taken in 6-7 months    . hydrALAZINE (APRESOLINE) 25 MG tablet Take 25 mg by mouth 3 (three) times daily.     . hydrochlorothiazide (HYDRODIURIL) 12.5 MG tablet Take 1 tablet (12.5 mg total) by mouth daily. 10 tablet 0  . isosorbide mononitrate (IMDUR) 30 MG 24 hr tablet Take 30 mg by mouth daily.    Marland Kitchen lidocaine-prilocaine (EMLA) cream Apply topically.    . metoprolol succinate (TOPROL-XL) 25 MG 24 hr tablet Take 25 mg by mouth daily.     . rosuvastatin (CRESTOR) 20  MG tablet Take 20 mg by mouth daily. In the morning    . sodium bicarbonate 650 MG tablet Take 650 mg by mouth 2 (two) times daily.     No current facility-administered medications on file prior to visit.    There are no Patient Instructions on file for this visit. No follow-ups on file.   Kris Hartmann, NP

## 2020-06-13 DIAGNOSIS — D510 Vitamin B12 deficiency anemia due to intrinsic factor deficiency: Secondary | ICD-10-CM | POA: Insufficient documentation

## 2020-08-14 ENCOUNTER — Other Ambulatory Visit: Payer: Self-pay

## 2020-08-14 ENCOUNTER — Emergency Department
Admission: EM | Admit: 2020-08-14 | Discharge: 2020-08-14 | Disposition: A | Discharge status: Home / Self Care | Payer: Medicare PPO | Attending: Emergency Medicine | Admitting: Emergency Medicine

## 2020-08-14 ENCOUNTER — Emergency Department: Payer: Medicare PPO

## 2020-08-14 ENCOUNTER — Encounter: Payer: Self-pay | Admitting: Intensive Care

## 2020-08-14 DIAGNOSIS — S92342A Displaced fracture of fourth metatarsal bone, left foot, initial encounter for closed fracture: Secondary | ICD-10-CM | POA: Insufficient documentation

## 2020-08-14 DIAGNOSIS — S92302A Fracture of unspecified metatarsal bone(s), left foot, initial encounter for closed fracture: Secondary | ICD-10-CM

## 2020-08-14 DIAGNOSIS — T1490XA Injury, unspecified, initial encounter: Secondary | ICD-10-CM

## 2020-08-14 DIAGNOSIS — S99922A Unspecified injury of left foot, initial encounter: Secondary | ICD-10-CM | POA: Diagnosis present

## 2020-08-14 DIAGNOSIS — S92322A Displaced fracture of second metatarsal bone, left foot, initial encounter for closed fracture: Secondary | ICD-10-CM | POA: Diagnosis not present

## 2020-08-14 DIAGNOSIS — W010XXA Fall on same level from slipping, tripping and stumbling without subsequent striking against object, initial encounter: Secondary | ICD-10-CM | POA: Diagnosis not present

## 2020-08-14 DIAGNOSIS — S8255XA Nondisplaced fracture of medial malleolus of left tibia, initial encounter for closed fracture: Secondary | ICD-10-CM

## 2020-08-14 DIAGNOSIS — S8252XA Displaced fracture of medial malleolus of left tibia, initial encounter for closed fracture: Secondary | ICD-10-CM | POA: Insufficient documentation

## 2020-08-14 MED ORDER — ACETAMINOPHEN 500 MG PO TABS
1000.0000 mg | ORAL_TABLET | ORAL | Status: AC
  Administered 2020-08-14: 1000 mg via ORAL
  Filled 2020-08-14: qty 2

## 2020-08-14 NOTE — Discharge Instructions (Addendum)
Please use your Aircast boot anytime that you are up and about.  You may take this off when you are resting in her bed in the evening.  Follow-up closely with podiatry, preferably within the next week.  Return to the ER if you have severe pain, numbness, cold or blue foot, worsening pain or discomfort, or new concerns arise.  Continue your normal dialysis schedule including tomorrow

## 2020-08-14 NOTE — ED Provider Notes (Signed)
See attestation/addendum on nurse practitioner note   Delman Kitten, MD 08/14/20 2019

## 2020-08-14 NOTE — ED Triage Notes (Signed)
Reports tripping/falling and hurting her left foot on Friday 08/04/20. C/o numbness across foot/toes. Receives dialysis Tues, thurs, Saturday. Access on left arm.

## 2020-08-14 NOTE — ED Triage Notes (Signed)
Emergency Medicine Provider Triage Evaluation Note  Shelby Galloway [facility] , a 75 y.o. female  was evaluated in triage.  Pt complains of left foot and ankle pain after mechanical, non-syncopal fall 17 days ago. She has been icing and elevating and using salve without relief. She is a dialysis patient, but has not missed any days. She is scheduled for tomorrow.  Review of Systems  Positive: Left foot and ankle pain Negative: Left knee or hip pain  Physical Exam  BP (!) 177/71 (BP Location: Right Arm)   Pulse 67   Temp 98.3 F (36.8 C) (Oral)   Resp 14   Ht '5\' 5"'$  (1.651 m)   Wt 54.4 kg   SpO2 99%   BMI 19.97 kg/m  Gen:   Awake, no distress   HEENT:  Atraumatic  Resp:  Normal effort  Cardiac:  Normal rate  Abd:   Nondistended, nontender  MSK:   Moves extremities without difficulty. Swelling of the foot and ankle. Neuro:  Speech clear   Medical Decision Making  Medically screening exam initiated at 5:35 PM.  Appropriate orders placed.  Bob Chhay [facility] was informed that the remainder of the evaluation will be completed by another provider, this initial triage assessment does not replace that evaluation, and the importance of remaining in the ED until their evaluation is complete.  Clinical Impression   Sprain of foot/ankle; Foot/ankle fracture   Victorino Dike, FNP 08/14/20 Billie Lade, MD 08/14/20 [phone]

## 2020-11-28 ENCOUNTER — Other Ambulatory Visit (INDEPENDENT_AMBULATORY_CARE_PROVIDER_SITE_OTHER): Payer: Self-pay | Admitting: Nurse Practitioner

## 2020-11-28 DIAGNOSIS — N186 End stage renal disease: Secondary | ICD-10-CM

## 2020-11-29 ENCOUNTER — Encounter (INDEPENDENT_AMBULATORY_CARE_PROVIDER_SITE_OTHER): Payer: Medicare PPO

## 2020-11-29 ENCOUNTER — Encounter (INDEPENDENT_AMBULATORY_CARE_PROVIDER_SITE_OTHER): Payer: Self-pay | Admitting: Nurse Practitioner

## 2020-11-29 ENCOUNTER — Ambulatory Visit (INDEPENDENT_AMBULATORY_CARE_PROVIDER_SITE_OTHER): Payer: Medicare PPO | Admitting: Nurse Practitioner

## 2020-12-08 ENCOUNTER — Encounter (INDEPENDENT_AMBULATORY_CARE_PROVIDER_SITE_OTHER): Payer: Self-pay | Admitting: Nurse Practitioner

## 2020-12-08 ENCOUNTER — Ambulatory Visit (INDEPENDENT_AMBULATORY_CARE_PROVIDER_SITE_OTHER): Payer: Medicare PPO

## 2020-12-08 ENCOUNTER — Other Ambulatory Visit: Payer: Self-pay

## 2020-12-08 ENCOUNTER — Ambulatory Visit (INDEPENDENT_AMBULATORY_CARE_PROVIDER_SITE_OTHER): Payer: Medicare PPO | Admitting: Nurse Practitioner

## 2020-12-08 VITALS — BP 133/72 | HR 69 | Resp 16 | Wt 131.4 lb

## 2020-12-08 DIAGNOSIS — I10 Essential (primary) hypertension: Secondary | ICD-10-CM

## 2020-12-08 DIAGNOSIS — N186 End stage renal disease: Secondary | ICD-10-CM

## 2020-12-08 DIAGNOSIS — E119 Type 2 diabetes mellitus without complications: Secondary | ICD-10-CM

## 2020-12-08 NOTE — Progress Notes (Signed)
Subjective:    Patient ID: Shelby Galloway, female    DOB: [birth year], 75 y.o.   MRN: [id number] Chief Complaint  Patient presents with  . Follow-up    6 month ultrasound follow up    The patient returns to the office for followup of their dialysis access. The function of the access has been stable. The patient endorses increased bleeding time or increased recirculation. Patient denies difficulty with cannulation. The patient denies hand pain or other symptoms consistent with steal phenomena.  No significant arm swelling.  The patient denies redness or swelling at the access site. The patient denies fever or chills at home or while on dialysis.  The patient denies amaurosis fugax or recent TIA symptoms. There are no recent neurological changes noted. The patient denies claudication symptoms or rest pain symptoms. The patient denies history of DVT, PE or superficial thrombophlebitis. The patient denies recent episodes of angina or shortness of breath.   Patient has a flow volume of 859.  This is decreased from previous flow volume of 1877.  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      Review of Systems  Hematological: Bruises/bleeds easily.  All other systems reviewed and are negative.      Objective:   Physical Exam Vitals reviewed.  HENT:     Head: Normocephalic.  Cardiovascular:     Rate and Rhythm: Normal rate.     Pulses: Normal pulses.     Arteriovenous access: left arteriovenous access is present.    Comments: Very pulsatile thrill with good bruit Pulmonary:     Effort: Pulmonary effort is normal.  Neurological:     Mental Status: She is alert and oriented to person, place, and time.     Motor: Weakness present.     Gait: Gait abnormal.  Psychiatric:        Mood and Affect: Mood normal.        Behavior: Behavior normal.        Thought Content: Thought content normal.        Judgment: Judgment normal.     BP  133/72 (BP Location: Right Arm)   Pulse 69   Resp 16   Wt 131 lb 6.4 oz (59.6 kg)   BMI 21.87 kg/m   Past Medical History:  Diagnosis Date  . Arthritis   . Diabetes mellitus without complication ([place])   . Gout   . Hypertension   . Renal disorder    stage 5 ckd    Social History   Socioeconomic History  . Marital status: Married    Spouse name: raymond  . Number of children: Not on file  . Years of education: Not on file  . Highest education level: Not on file  Occupational History  . 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 Smoking status: Former Smoker    Packs/day: 1.00    Types: Cigarettes    Quit date: 2010    Years since quitting: 12.3  . Smokeless tobacco: Never Used  Vaping Use  . Vaping Use: Never used  Substance and Sexual Activity  . Alcohol use: No  . Drug use: Never  . Sexual activity: Not Currently  Other Topics Concern  . Not on file  Social History Narrative  . Not on file   Social Determinants of Health   Financial Resource Strain: Not on file  Food  Insecurity: Not on file  Transportation Needs: Not on file  Physical Activity: Not on file  Stress: Not on file  Social Connections: Not on file  Intimate Partner Violence: Not on file    Past Surgical History:  Procedure Laterality Date  . A/V FISTULAGRAM Left 04/24/2020   Procedure: A/V FISTULAGRAM;  Surgeon: Algernon Huxley, MD;  Location: [place] [facility];  Service: Cardiovascular;  Laterality: Left;  . ABDOMINAL HYSTERECTOMY    . AV FISTULA PLACEMENT Left 08/04/2019   Procedure: INSERTION OF ARTERIOVENOUS (AV) GORE-TEX GRAFT ARM;  Surgeon: Algernon Huxley, MD;  Location: [facility];  Service: Vascular;  Laterality: Left;    Family History  Problem Relation Age of Onset  . Breast cancer Neg Hx     Allergies  Allergen Reactions  . Losartan     States worsened gout  . Ace Inhibitors     Sinus issues  . Atorvastatin     Sinus side effects   . Chlorthalidone     Sinus issues  . Codeine Other (See Comments)    States she feels "funny " in the head    CBC Latest Ref Rng & Units 08/02/2019 01/16/2016  WBC 4.0 - 10.5 K/uL 6.6 9.5  Hemoglobin 12.0 - 15.0 g/dL 7.9(L) 8.0(L)  Hematocrit 36.0 - 46.0 % 24.3(L) 24.1(L)  Platelets 150 - 400 K/uL 249 298      CMP     Component Value Date/Time   NA 140 08/02/2019 1258   K 6.1 (H) 04/24/2020 0956   CL 107 08/02/2019 1258   CO2 21 (L) 08/02/2019 1258   GLUCOSE 136 (H) 08/02/2019 1258   BUN 46 (H) 08/02/2019 1258   CREATININE 4.88 (H) 08/02/2019 1258   CALCIUM 9.2 08/02/2019 1258   PROT 7.2 01/16/2016 1942   ALBUMIN 3.2 (L) 01/16/2016 1942   AST 13 (L) 01/16/2016 1942   ALT 8 (L) 01/16/2016 1942   ALKPHOS 67 01/16/2016 1942   BILITOT <0.1 (L) 01/16/2016 1942   GFRNONAA 8 (L) 08/02/2019 1258   GFRAA 10 (L) 08/02/2019 1258     No results found.     Assessment & Plan:   1. ESRD (end stage renal disease) ([place]) Recommend:  The patient is experiencing increasing problems with their dialysis access.  Patient should have a fistulagram with the intention for intervention.  The intention for intervention is to restore appropriate flow and prevent thrombosis and possible loss of the access.  As well as improve the quality of dialysis therapy.  The risks, benefits and alternative therapies were reviewed in detail with the patient.  All questions were answered.  The patient agrees to proceed with angio/intervention.      2. Primary hypertension Continue antihypertensive medications as already ordered, these medications have been reviewed and there are no changes at this time.   3. Type 2 diabetes mellitus without complication, without long-term current use of insulin (HCC) Continue hypoglycemic medications as already ordered, these medications have been reviewed and there are no changes at this time.  Hgb A1C to be monitored as already arranged by primary  service    Current Outpatient Medications on File Prior to Visit  Medication Sig Dispense Refill  . acetaminophen (TYLENOL) 500 MG tablet Take 500 mg by mouth every 6 (six) hours as needed.    Marland Kitchen allopurinol (ZYLOPRIM) 100 MG tablet Take 100 mg by mouth daily. Takes in the morning.    Marland Kitchen amLODipine (NORVASC) 10 MG tablet Take 10 mg by  mouth daily. In the morning    . Cholecalciferol (VITAMIN D3) 2000 units capsule Take 2,000 Units by mouth daily.     . ferrous sulfate 325 (65 FE) MG tablet Take 325 mg by mouth daily with breakfast.     . fluticasone (FLONASE) 50 MCG/ACT nasal spray 2 sprays by Each Nare route daily.    . furosemide (LASIX) 20 MG tablet Take 20 mg by mouth daily.     Marland Kitchen glipiZIDE (GLUCOTROL XL) 10 MG 24 hr tablet Take by mouth. Patient has not taken in 6-7 months    . hydrALAZINE (APRESOLINE) 25 MG tablet Take 25 mg by mouth 3 (three) times daily.     . hydrochlorothiazide (HYDRODIURIL) 12.5 MG tablet Take 1 tablet (12.5 mg total) by mouth daily. 10 tablet 0  . isosorbide mononitrate (IMDUR) 30 MG 24 hr tablet Take 30 mg by mouth daily.    Marland Kitchen lidocaine-prilocaine (EMLA) cream Apply topically.    . metoprolol succinate (TOPROL-XL) 25 MG 24 hr tablet Take 25 mg by mouth daily.     . rosuvastatin (CRESTOR) 20 MG tablet Take 20 mg by mouth daily. In the morning    . sodium bicarbonate 650 MG tablet Take 650 mg by mouth 2 (two) times daily.     No current facility-administered medications on file prior to visit.    There are no Patient Instructions on file for this visit. No follow-ups on file.   Kris Hartmann, NP

## 2020-12-08 NOTE — H&P (View-Only) (Signed)
Subjective:    Patient ID: Shelby Galloway, female    DOB: 17-Feb-1946, 75 y.o.   MRN: [id number] Chief Complaint  Patient presents with  . Follow-up    6 month ultrasound follow up    The patient returns to the office for followup of their dialysis access. The function of the access has been stable. The patient endorses increased bleeding time or increased recirculation. Patient denies difficulty with cannulation. The patient denies hand pain or other symptoms consistent with steal phenomena.  No significant arm swelling.  The patient denies redness or swelling at the access site. The patient denies fever or chills at home or while on dialysis.  The patient denies amaurosis fugax or recent TIA symptoms. There are no recent neurological changes noted. The patient denies claudication symptoms or rest pain symptoms. The patient denies history of DVT, PE or superficial thrombophlebitis. The patient denies recent episodes of angina or shortness of breath.   Patient has a flow volume of 859.  This is decreased from previous flow volume of 1877.  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      Review of Systems  Hematological: Bruises/bleeds easily.  All other systems reviewed and are negative.      Objective:   Physical Exam Vitals reviewed.  HENT:     Head: Normocephalic.  Cardiovascular:     Rate and Rhythm: Normal rate.     Pulses: Normal pulses.     Arteriovenous access: left arteriovenous access is present.    Comments: Very pulsatile thrill with good bruit Pulmonary:     Effort: Pulmonary effort is normal.  Neurological:     Mental Status: She is alert and oriented to person, place, and time.     Motor: Weakness present.     Gait: Gait abnormal.  Psychiatric:        Mood and Affect: Mood normal.        Behavior: Behavior normal.        Thought Content: Thought content normal.        Judgment: Judgment normal.     BP  133/72 (BP Location: Right Arm)   Pulse 69   Resp 16   Wt 131 lb 6.4 oz (59.6 kg)   BMI 21.87 kg/m   Past Medical History:  Diagnosis Date  . Arthritis   . Diabetes mellitus without complication ([place])   . Gout   . Hypertension   . Renal disorder    stage 5 ckd    Social History   Socioeconomic History  . Marital status: Married    Spouse name: raymond  . Number of children: Not on file  . Years of education: Not on file  . Highest education level: Not on file  Occupational History  . 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 Smoking status: Former Smoker    Packs/day: 1.00    Types: Cigarettes    Quit date: 2010    Years since quitting: 12.3  . Smokeless tobacco: Never Used  Vaping Use  . Vaping Use: Never used  Substance and Sexual Activity  . Alcohol use: No  . Drug use: Never  . Sexual activity: Not Currently  Other Topics Concern  . Not on file  Social History Narrative  . Not on file   Social Determinants of Health   Financial Resource Strain: Not on file  Food  Insecurity: Not on file  Transportation Needs: Not on file  Physical Activity: Not on file  Stress: Not on file  Social Connections: Not on file  Intimate Partner Violence: Not on file    Past Surgical History:  Procedure Laterality Date  . A/V FISTULAGRAM Left 04/24/2020   Procedure: A/V FISTULAGRAM;  Surgeon: Algernon Huxley, MD;  Location: [facility];  Service: Cardiovascular;  Laterality: Left;  . ABDOMINAL HYSTERECTOMY    . AV FISTULA PLACEMENT Left 08/04/2019   Procedure: INSERTION OF ARTERIOVENOUS (AV) GORE-TEX GRAFT ARM;  Surgeon: Algernon Huxley, MD;  Location: [facility];  Service: Vascular;  Laterality: Left;    Family History  Problem Relation Age of Onset  . Breast cancer Neg Hx     Allergies  Allergen Reactions  . Losartan     States worsened gout  . Ace Inhibitors     Sinus issues  . Atorvastatin     Sinus side effects   . Chlorthalidone     Sinus issues  . Codeine Other (See Comments)    States she feels "funny " in the head    CBC Latest Ref Rng & Units 08/02/2019 01/16/2016  WBC 4.0 - 10.5 K/uL 6.6 9.5  Hemoglobin 12.0 - 15.0 g/dL 7.9(L) 8.0(L)  Hematocrit 36.0 - 46.0 % 24.3(L) 24.1(L)  Platelets 150 - 400 K/uL 249 298      CMP     Component Value Date/Time   NA 140 08/02/2019 1258   K 6.1 (H) 04/24/2020 0956   CL 107 08/02/2019 1258   CO2 21 (L) 08/02/2019 1258   GLUCOSE 136 (H) 08/02/2019 1258   BUN 46 (H) 08/02/2019 1258   CREATININE 4.88 (H) 08/02/2019 1258   CALCIUM 9.2 08/02/2019 1258   PROT 7.2 01/16/2016 1942   ALBUMIN 3.2 (L) 01/16/2016 1942   AST 13 (L) 01/16/2016 1942   ALT 8 (L) 01/16/2016 1942   ALKPHOS 67 01/16/2016 1942   BILITOT <0.1 (L) 01/16/2016 1942   GFRNONAA 8 (L) 08/02/2019 1258   GFRAA 10 (L) 08/02/2019 1258     No results found.     Assessment & Plan:   1. ESRD (end stage renal disease) ([place]) Recommend:  The patient is experiencing increasing problems with their dialysis access.  Patient should have a fistulagram with the intention for intervention.  The intention for intervention is to restore appropriate flow and prevent thrombosis and possible loss of the access.  As well as improve the quality of dialysis therapy.  The risks, benefits and alternative therapies were reviewed in detail with the patient.  All questions were answered.  The patient agrees to proceed with angio/intervention.      2. Primary hypertension Continue antihypertensive medications as already ordered, these medications have been reviewed and there are no changes at this time.   3. Type 2 diabetes mellitus without complication, without long-term current use of insulin (HCC) Continue hypoglycemic medications as already ordered, these medications have been reviewed and there are no changes at this time.  Hgb A1C to be monitored as already arranged by primary  service    Current Outpatient Medications on File Prior to Visit  Medication Sig Dispense Refill  . acetaminophen (TYLENOL) 500 MG tablet Take 500 mg by mouth every 6 (six) hours as needed.    Marland Kitchen allopurinol (ZYLOPRIM) 100 MG tablet Take 100 mg by mouth daily. Takes in the morning.    Marland Kitchen amLODipine (NORVASC) 10 MG tablet Take 10 mg by  mouth daily. In the morning    . Cholecalciferol (VITAMIN D3) 2000 units capsule Take 2,000 Units by mouth daily.     . ferrous sulfate 325 (65 FE) MG tablet Take 325 mg by mouth daily with breakfast.     . fluticasone (FLONASE) 50 MCG/ACT nasal spray 2 sprays by Each Nare route daily.    . furosemide (LASIX) 20 MG tablet Take 20 mg by mouth daily.     Marland Kitchen glipiZIDE (GLUCOTROL XL) 10 MG 24 hr tablet Take by mouth. Patient has not taken in 6-7 months    . hydrALAZINE (APRESOLINE) 25 MG tablet Take 25 mg by mouth 3 (three) times daily.     . hydrochlorothiazide (HYDRODIURIL) 12.5 MG tablet Take 1 tablet (12.5 mg total) by mouth daily. 10 tablet 0  . isosorbide mononitrate (IMDUR) 30 MG 24 hr tablet Take 30 mg by mouth daily.    Marland Kitchen lidocaine-prilocaine (EMLA) cream Apply topically.    . metoprolol succinate (TOPROL-XL) 25 MG 24 hr tablet Take 25 mg by mouth daily.     . rosuvastatin (CRESTOR) 20 MG tablet Take 20 mg by mouth daily. In the morning    . sodium bicarbonate 650 MG tablet Take 650 mg by mouth 2 (two) times daily.     No current facility-administered medications on file prior to visit.    There are no Patient Instructions on file for this visit. No follow-ups on file.   Kris Hartmann, NP

## 2020-12-12 ENCOUNTER — Telehealth (INDEPENDENT_AMBULATORY_CARE_PROVIDER_SITE_OTHER): Payer: Self-pay

## 2020-12-12 NOTE — Telephone Encounter (Signed)
Spoke with the patient and offered 12/25/20 for her left arm fistulagram, patient was at dialysis and stated she didn't know if that would work and will call me back.

## 2020-12-13 NOTE — Telephone Encounter (Signed)
Spoke with the patient and she is scheduled with Dr. Lucky Cowboy on 12/25/20 for a left arm fistulagram with a 9:00 am arrival time to the [facility]. Pre-procedure instructions were discussed and will be mailed.

## 2020-12-25 ENCOUNTER — Other Ambulatory Visit (INDEPENDENT_AMBULATORY_CARE_PROVIDER_SITE_OTHER): Payer: Self-pay | Admitting: Nurse Practitioner

## 2020-12-25 ENCOUNTER — Encounter
Admission: RE | Disposition: A | Discharge status: Home / Self Care | Payer: Self-pay | Source: Home / Self Care | Attending: Vascular Surgery

## 2020-12-25 ENCOUNTER — Other Ambulatory Visit: Payer: Self-pay

## 2020-12-25 ENCOUNTER — Encounter: Payer: Self-pay | Admitting: Vascular Surgery

## 2020-12-25 ENCOUNTER — Ambulatory Visit
Admission: RE | Admit: 2020-12-25 | Discharge: 2020-12-25 | Disposition: A | Discharge status: Home / Self Care | Payer: Medicare PPO | Attending: Vascular Surgery | Admitting: Vascular Surgery

## 2020-12-25 DIAGNOSIS — Y832 Surgical operation with anastomosis, bypass or graft as the cause of abnormal reaction of the patient, or of later complication, without mention of misadventure at the time of the procedure: Secondary | ICD-10-CM | POA: Diagnosis not present

## 2020-12-25 DIAGNOSIS — Z7984 Long term (current) use of oral hypoglycemic drugs: Secondary | ICD-10-CM | POA: Diagnosis not present

## 2020-12-25 DIAGNOSIS — E1122 Type 2 diabetes mellitus with diabetic chronic kidney disease: Secondary | ICD-10-CM | POA: Diagnosis not present

## 2020-12-25 DIAGNOSIS — Y841 Kidney dialysis as the cause of abnormal reaction of the patient, or of later complication, without mention of misadventure at the time of the procedure: Secondary | ICD-10-CM | POA: Insufficient documentation

## 2020-12-25 DIAGNOSIS — I120 Hypertensive chronic kidney disease with stage 5 chronic kidney disease or end stage renal disease: Secondary | ICD-10-CM | POA: Insufficient documentation

## 2020-12-25 DIAGNOSIS — Z79899 Other long term (current) drug therapy: Secondary | ICD-10-CM | POA: Insufficient documentation

## 2020-12-25 DIAGNOSIS — Z992 Dependence on renal dialysis: Secondary | ICD-10-CM | POA: Diagnosis not present

## 2020-12-25 DIAGNOSIS — Z87891 Personal history of nicotine dependence: Secondary | ICD-10-CM | POA: Diagnosis not present

## 2020-12-25 DIAGNOSIS — T82858A Stenosis of vascular prosthetic devices, implants and grafts, initial encounter: Secondary | ICD-10-CM

## 2020-12-25 DIAGNOSIS — N186 End stage renal disease: Secondary | ICD-10-CM | POA: Diagnosis not present

## 2020-12-25 HISTORY — PX: A/V FISTULAGRAM: CATH118298

## 2020-12-25 LAB — POTASSIUM (ARMC VASCULAR LAB ONLY): Potassium (ARMC vascular lab): 4.4 (ref 3.5–5.1)

## 2020-12-25 LAB — GLUCOSE, CAPILLARY: Glucose-Capillary: 84 mg/dL (ref 70–99)

## 2020-12-25 SURGERY — A/V FISTULAGRAM
Anesthesia: Moderate Sedation | Laterality: Left

## 2020-12-25 MED ORDER — DIPHENHYDRAMINE HCL 50 MG/ML IJ SOLN: 50.0000 mg | Freq: Once | INTRAMUSCULAR | Status: DC | PRN

## 2020-12-25 MED ORDER — SODIUM CHLORIDE 0.9 % IV SOLN
INTRAVENOUS | Status: AC
  Administered 2020-12-25: 1 g via INTRAVENOUS
  Filled 2020-12-25: qty 10

## 2020-12-25 MED ORDER — ONDANSETRON HCL 4 MG/2ML IJ SOLN
INTRAMUSCULAR | Status: AC
  Administered 2020-12-25: 4 mg via INTRAVENOUS
  Filled 2020-12-25: qty 2

## 2020-12-25 MED ORDER — HYDROMORPHONE HCL 1 MG/ML IJ SOLN: 1.0000 mg | Freq: Once | INTRAMUSCULAR | Status: DC | PRN

## 2020-12-25 MED ORDER — FENTANYL CITRATE (PF) 100 MCG/2ML IJ SOLN
INTRAMUSCULAR | Status: DC | PRN
  Administered 2020-12-25 (×2): 50 ug via INTRAVENOUS

## 2020-12-25 MED ORDER — MIDAZOLAM HCL 2 MG/2ML IJ SOLN
INTRAMUSCULAR | Status: DC | PRN
  Administered 2020-12-25 (×2): 1 mg via INTRAVENOUS

## 2020-12-25 MED ORDER — SODIUM CHLORIDE 0.9 % IV SOLN: 1.0000 g | Freq: Once | INTRAVENOUS | Status: AC

## 2020-12-25 MED ORDER — SODIUM CHLORIDE 0.9 % IV SOLN: INTRAVENOUS | Status: DC

## 2020-12-25 MED ORDER — ONDANSETRON HCL 4 MG/2ML IJ SOLN: 4.0000 mg | Freq: Four times a day (QID) | INTRAMUSCULAR | Status: DC | PRN

## 2020-12-25 MED ORDER — HEPARIN SODIUM (PORCINE) 1000 UNIT/ML IJ SOLN
INTRAMUSCULAR | Status: DC | PRN
  Administered 2020-12-25: 3000 [IU] via INTRAVENOUS

## 2020-12-25 MED ORDER — MIDAZOLAM HCL 2 MG/2ML IJ SOLN
INTRAMUSCULAR | Status: AC
  Filled 2020-12-25: qty 2

## 2020-12-25 MED ORDER — HEPARIN SODIUM (PORCINE) 1000 UNIT/ML IJ SOLN
INTRAMUSCULAR | Status: AC
  Filled 2020-12-25: qty 1

## 2020-12-25 MED ORDER — MIDAZOLAM HCL 2 MG/ML PO SYRP: 8.0000 mg | ORAL_SOLUTION | Freq: Once | ORAL | Status: DC | PRN

## 2020-12-25 MED ORDER — FENTANYL CITRATE (PF) 100 MCG/2ML IJ SOLN
INTRAMUSCULAR | Status: AC
  Filled 2020-12-25: qty 2

## 2020-12-25 MED ORDER — FAMOTIDINE 20 MG PO TABS: 40.0000 mg | ORAL_TABLET | Freq: Once | ORAL | Status: DC | PRN

## 2020-12-25 MED ORDER — METHYLPREDNISOLONE SODIUM SUCC 125 MG IJ SOLR: 125.0000 mg | Freq: Once | INTRAMUSCULAR | Status: DC | PRN

## 2020-12-25 SURGICAL SUPPLY — 10 items
BALLN LUTONIX 7X100X130 (BALLOONS) ×2
BALLOON LUTONIX 7X100X130 (BALLOONS) ×1 IMPLANT
CANNULA 5F STIFF (CANNULA) ×2 IMPLANT
COVER PROBE U/S 5X48 (MISCELLANEOUS) ×2 IMPLANT
DRAPE BRACHIAL (DRAPES) ×2 IMPLANT
KIT ENCORE 26 ADVANTAGE (KITS) ×2 IMPLANT
PACK ANGIOGRAPHY (CUSTOM PROCEDURE TRAY) ×2 IMPLANT
SHEATH BRITE TIP 6FRX5.5 (SHEATH) ×2 IMPLANT
SUT MNCRL AB 4-0 PS2 18 (SUTURE) ×2 IMPLANT
WIRE MAGIC TOR.035 180C (WIRE) ×2 IMPLANT

## 2020-12-25 NOTE — OR Nursing (Signed)
Bruit felt to AV fistula in L upper extremity

## 2020-12-25 NOTE — Op Note (Signed)
[facility]    OPERATIVE NOTE   PROCEDURE: 1.   Left brachiocephalic arteriovenous fistula cannulation under ultrasound guidance 2.   Left arm fistulagram including central venogram 3.   Percutaneous transluminal angioplasty of proximal upper arm cephalic vein with 7 mm diameter by 10 cm length Lutonix drug-coated angioplasty balloon  PRE-OPERATIVE DIAGNOSIS: 1. ESRD 2. Poorly functional left brachiocephalic AVF  POST-OPERATIVE DIAGNOSIS: same as above   SURGEON: Leotis Pain, MD  ANESTHESIA: local with MCS  ESTIMATED BLOOD LOSS: 10 cc  FINDING(S): 1. 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  The cephalic vein subclavian vein confluence and the remainder of the central venous circulation was widely patent.  The access site was irregular but patent and the anastomotic site was patent.  SPECIMEN(S):  None  CONTRAST: 15 cc  FLUORO TIME: 1.1 minutes  MODERATE CONSCIOUS SEDATION TIME: Approximately 15 minutes with 2 mg of Versed and 100 mcg of Fentanyl   INDICATIONS: Shelby Galloway is a 75 y.o. female who presents with malfunctioning left brachiocephalic arteriovenous fistula.  The patient is scheduled for left arm fistulagram.  The patient is aware the risks include but are not limited to: bleeding, infection, thrombosis of the cannulated access, and possible anaphylactic reaction to the contrast.  The patient is aware of the risks of the procedure and elects to proceed forward.  DESCRIPTION: After full informed written consent was obtained, the patient was brought back to the angiography suite and placed supine upon the angiography table.  The patient was connected to monitoring equipment. Moderate conscious sedation was administered with a face to face encounter with the patient throughout the procedure with my supervision of the RN administering medicines and monitoring the patient's vital  signs and mental status throughout from the start of the procedure until the patient was taken to the recovery room. The left arm was prepped and draped in the standard fashion for a percutaneous access intervention.  Under ultrasound guidance, the left brachiocephalic arteriovenous fistula was cannulated with a micropuncture needle under direct ultrasound guidance where it was patent and a permanent image was performed.  The microwire was advanced into the fistula and the needle was exchanged for the a microsheath.  I then upsized to a 6 Fr Sheath and imaging was performed.  Hand injections were completed to image the access including the central venous system. This demonstrated a very high-grade stenosis in the proximal upper arm cephalic vein in the 64% range over a 5 to 7 cm range.  The cephalic vein subclavian vein confluence and the remainder of the central venous circulation was widely patent.  The access site was irregular but patent and the anastomotic site was patent.  Based on the images, this patient will need intervention to this proximal upper arm cephalic vein stenosis. I then gave the patient 3000 units of intravenous heparin.  I then crossed the stenosis with a Magic Tourqe wire.  Based on the imaging, a 7 mm x 10 cm Lutonix drug-coated angioplasty balloon was selected.  The balloon was centered around the proximal upper arm cephalic vein stenosis and inflated to 8 ATM for 1 minute(s).  On completion imaging, a 15-20% residual stenosis was present.     Based on the completion imaging, no further intervention is necessary.  The wire and balloon were removed from the sheath.  A 4-0 Monocryl purse-string suture was sewn around the sheath.  The sheath was removed while tying down the suture.  A sterile bandage was applied to the puncture site.  COMPLICATIONS: None  CONDITION: Stable   Leotis Pain  12/25/2020 10:52 AM   This note was created with Dragon Medical transcription system. Any  errors in dictation are purely unintentional.

## 2020-12-25 NOTE — Interval H&P Note (Signed)
History and Physical Interval Note:  12/25/2020 9:40 AM  Shelby Galloway  has presented today for surgery, with the diagnosis of LT arm fistulagram.  The various methods of treatment have been discussed with the patient and family. After consideration of risks, benefits and other options for treatment, the patient has consented to  Procedure(s): A/V FISTULAGRAM (Left) as a surgical intervention.  The patient's history has been reviewed, patient examined, no change in status, stable for surgery.  I have reviewed the patient's chart and labs.  Questions were answered to the patient's satisfaction.     Leotis Pain

## 2020-12-25 NOTE — Discharge Instructions (Signed)
Dialysis Fistulogram, Care After The following information offers guidance on how to care for yourself after your procedure. Your health care provider may also give you more specific instructions. If you have problems or questions, contact your health care provider. What can I expect after the procedure? After the procedure, it is common to have:  A small amount of discomfort in the area where the small tube (catheter) was placed for the procedure.  A small amount of bruising around the fistula.  Sleepiness and tiredness (fatigue). Follow these instructions at home: Puncture site care  Follow instructions from your health care provider about how to take care of the site where catheters were inserted. Make sure you: ? Wash your hands with soap and water for at least 20 seconds before and after you change your bandage (dressing). If soap and water are not available, use hand sanitizer. ? Change your dressing as told by your health care provider. ? Leave stitches (sutures), skin glue, or adhesive strips in place. These skin closures may need to stay in place for 2 weeks or longer. If adhesive strip edges start to loosen and curl up, you may trim the loose edges. Do not remove adhesive strips completely unless your health care provider tells you to do that.  Check your puncture area every day for signs of infection. Check for: ? More redness, swelling, or pain. ? Fluid or blood. ? Warmth. ? Pus or a bad smell.   Activity  Rest as much as you can.  If you were given a sedative during the procedure, it can affect you for several hours. Do not drive or operate machinery until your health care provider says that it is safe.  Do not lift anything that is heavier than 5 lb (2.3 kg), or the limit that you are told, on the day of your procedure.  Do not do anything strenuous with your arm for the rest of the day. Avoid household activities, such as vacuuming.  Return to your normal activities as  told by your health care provider. Ask your health care provider what activities are safe for you. Safety To prevent damage to your graft or fistula:  Do not wear tight-fitting clothing or jewelry on the arm or leg that has your graft or fistula.  Tell all your health care providers that you have a dialysis fistula or graft.  Do not allow blood draws, IVs, or blood pressure readings to be done in the arm that has your fistula or graft.  Do not allow flu shots or vaccinations in the arm with your fistula or graft. General instructions  Take over-the-counter and prescription medicines only as told by your health care provider.  Do not take baths, swim, or use a hot tub until your health care provider approves. Ask your health care provider if you may take showers. You may only be allowed to take sponge baths.  Monitor your dialysis fistula closely. Check to make sure that you can feel a vibration or buzz (a thrill) when you put your fingers over the fistula.  Keep all follow-up visits. This is important. Contact a health care provider if:  You have more redness, swelling, or pain at the site where the catheter was put in.  You have fluid or blood coming from the catheter site.  You have pus or a bad smell coming from the catheter site.  Your catheter site feels warm.  You have a fever or chills. Get help right away if:    You have bleeding from the vascular access site that does not stop.  You feel weak.  You have trouble balancing.  You have trouble moving your arms or legs.  You have problems with your speech or vision.  You can no longer feel a vibration or buzz when you put your fingers over your fistula.  The limb that was used for the procedure swells or becomes painful, cold, blue, or pale white.  You have chest pain or shortness of breath. These symptoms may represent a serious problem that is an emergency. Do not wait to see if the symptoms will go away. Get  medical help right away. Call your local emergency services (911 in the [place] Do not drive yourself to the hospital. Summary  After a dialysis fistulogram, it is common to have a small amount of discomfort or bruising in the area where the small, thin tube (catheter) was placed.  Rest as much as you can after your procedure. Return to your normal activities as told by your health care provider.  Take over-the-counter and prescription medicines only as told by your health care provider.  Follow instructions from your health care provider about how to take care of the site where the catheter was inserted.  Keep all follow-up visits. This is important. This information is not intended to replace advice given to you by your health care provider. Make sure you discuss any questions you have with your health care provider. Document Revised: 02/16/2020 Document Reviewed: 02/16/2020 Elsevier Patient Education  2021 [place]. Moderate Conscious Sedation, Adult, Care After This sheet gives you information about how to care for yourself after your procedure. Your health care provider may also give you more specific instructions. If you have problems or questions, contact your health care provider. What can I expect after the procedure? After the procedure, it is common to have:  Sleepiness for several hours.  Impaired judgment for several hours.  Difficulty with balance.  Vomiting if you eat too soon. Follow these instructions at home: For the time period you were told by your health care provider:  Rest.  Do not participate in activities where you could fall or become injured.  Do not drive or use machinery.  Do not drink alcohol.  Do not take sleeping pills or medicines that cause drowsiness.  Do not make important decisions or sign legal documents.  Do not take care of children on your own.      Eating and drinking  Follow the diet recommended by your health care  provider.  Drink enough fluid to keep your urine pale yellow.  If you vomit: ? Drink water, juice, or soup when you can drink without vomiting. ? Make sure you have little or no nausea before eating solid foods.   General instructions  Take over-the-counter and prescription medicines only as told by your health care provider.  Have a responsible adult stay with you for the time you are told. It is important to have someone help care for you until you are awake and alert.  Do not smoke.  Keep all follow-up visits as told by your health care provider. This is important. Contact a health care provider if:  You are still sleepy or having trouble with balance after 24 hours.  You feel light-headed.  You keep feeling nauseous or you keep vomiting.  You develop a rash.  You have a fever.  You have redness or swelling around the IV site. Get help right away if:  You have trouble breathing.  You have new-onset confusion at home. Summary  After the procedure, it is common to feel sleepy, have impaired judgment, or feel nauseous if you eat too soon.  Rest after you get home. Know the things you should not do after the procedure.  Follow the diet recommended by your health care provider and drink enough fluid to keep your urine pale yellow.  Get help right away if you have trouble breathing or new-onset confusion at home. This information is not intended to replace advice given to you by your health care provider. Make sure you discuss any questions you have with your health care provider. Document Revised: 11/05/2019 Document Reviewed: 06/03/2019 Elsevier Patient Education  2021 [employer].

## 2021-02-02 ENCOUNTER — Other Ambulatory Visit (INDEPENDENT_AMBULATORY_CARE_PROVIDER_SITE_OTHER): Payer: Self-pay | Admitting: Vascular Surgery

## 2021-02-02 DIAGNOSIS — N186 End stage renal disease: Secondary | ICD-10-CM

## 2021-02-02 DIAGNOSIS — Z9862 Peripheral vascular angioplasty status: Secondary | ICD-10-CM

## 2021-02-05 ENCOUNTER — Ambulatory Visit (INDEPENDENT_AMBULATORY_CARE_PROVIDER_SITE_OTHER): Payer: Medicare PPO | Admitting: Nurse Practitioner

## 2021-02-05 ENCOUNTER — Other Ambulatory Visit: Payer: Self-pay

## 2021-02-05 ENCOUNTER — Ambulatory Visit (INDEPENDENT_AMBULATORY_CARE_PROVIDER_SITE_OTHER): Payer: Medicare PPO

## 2021-02-05 ENCOUNTER — Encounter (INDEPENDENT_AMBULATORY_CARE_PROVIDER_SITE_OTHER): Payer: Self-pay | Admitting: Nurse Practitioner

## 2021-02-05 ENCOUNTER — Other Ambulatory Visit (INDEPENDENT_AMBULATORY_CARE_PROVIDER_SITE_OTHER): Payer: Medicare PPO

## 2021-02-05 VITALS — BP 109/52 | HR 63 | Ht 66.0 in | Wt 126.0 lb

## 2021-02-05 DIAGNOSIS — N186 End stage renal disease: Secondary | ICD-10-CM | POA: Diagnosis not present

## 2021-02-05 DIAGNOSIS — I10 Essential (primary) hypertension: Secondary | ICD-10-CM | POA: Diagnosis not present

## 2021-02-05 DIAGNOSIS — Z9862 Peripheral vascular angioplasty status: Secondary | ICD-10-CM | POA: Diagnosis not present

## 2021-02-05 DIAGNOSIS — E119 Type 2 diabetes mellitus without complications: Secondary | ICD-10-CM

## 2021-02-13 ENCOUNTER — Encounter (INDEPENDENT_AMBULATORY_CARE_PROVIDER_SITE_OTHER): Payer: Self-pay | Admitting: Nurse Practitioner

## 2021-02-13 NOTE — Progress Notes (Signed)
Subjective:    Patient ID: Shelby Galloway, female    DOB: 1946/04/16, 75 y.o.   MRN: [id number] Chief Complaint  Patient presents with  . Follow-up    6 wk post [facility]     The patient returns to the office for followup status post intervention of the dialysis access left brachiocephalic AV fistula. Following the intervention the access function has significantly improved, with better flow rates and improved KT/V. The patient has not been experiencing increased bleeding times following decannulation and the patient denies increased recirculation. The patient denies an increase in arm swelling. At the present time the patient denies hand pain.  The patient denies amaurosis fugax or recent TIA symptoms. There are no recent neurological changes noted. The patient denies claudication symptoms or rest pain symptoms. The patient denies history of DVT, PE or superficial thrombophlebitis. The patient denies recent episodes of angina or shortness of breath.   Today the patient has a flow volume of 1750 .  There is still some narrowing in the proximal upper arm but it is not as significant it is not causing any issues currently.       Review of Systems  Hematological:  Does not bruise/bleed easily.  All other systems reviewed and are negative.     Objective:   Physical Exam Vitals reviewed.  HENT:     Head: Normocephalic.  Cardiovascular:     Rate and Rhythm: Normal rate.     Pulses: Normal pulses.     Arteriovenous access: Left arteriovenous access is present.    Comments: Thrill and bruit good Pulmonary:     Effort: Pulmonary effort is normal.  Neurological:     Mental Status: She is alert and oriented to person, place, and time.  Psychiatric:        Mood and Affect: Mood normal.        Behavior: Behavior normal.        Thought Content: Thought content normal.        Judgment: Judgment normal.    BP (!) 109/52   Pulse 63   Ht '5\' 6"'$  (1.676 m)   Wt 126 lb (57.2 kg)   BMI  20.34 kg/m   Past Medical History:  Diagnosis Date  . Arthritis   . Diabetes mellitus without complication ([place])   . Gout   . Hypertension   . Renal disorder    stage 5 ckd    Social History   Socioeconomic History  . Marital status: Married    Spouse name: raymond  . Number of children: Not on file  . Years of education: Not on file  . Highest education level: Not on file  Occupational History  . 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 Smoking status: Former    Packs/day: 1.00    Types: Cigarettes    Quit date: 2010    Years since quitting: 12.5  . Smokeless tobacco: Never  Vaping Use  . Vaping Use: Never used  Substance and Sexual Activity  . Alcohol use: No  . Drug use: Never  . Sexual activity: Not Currently  Other Topics Concern  . Not on file  Social History Narrative  . Not on file   Social Determinants of Health   Financial Resource Strain: Not on file  Food Insecurity: Not on file  Transportation Needs: Not on file  Physical Activity: Not on file  Stress: Not on file  Social Connections: Not on file  Intimate Partner Violence: Not on file    Past Surgical History:  Procedure Laterality Date  . A/V FISTULAGRAM Left 04/24/2020   Procedure: A/V FISTULAGRAM;  Surgeon: Algernon Huxley, MD;  Location: [place] [facility];  Service: Cardiovascular;  Laterality: Left;  . A/V FISTULAGRAM Left 12/25/2020   Procedure: A/V FISTULAGRAM;  Surgeon: Algernon Huxley, MD;  Location: [place] [facility];  Service: Cardiovascular;  Laterality: Left;  . ABDOMINAL HYSTERECTOMY    . AV FISTULA PLACEMENT Left 08/04/2019   Procedure: INSERTION OF ARTERIOVENOUS (AV) GORE-TEX GRAFT ARM;  Surgeon: Algernon Huxley, MD;  Location: [facility];  Service: Vascular;  Laterality: Left;    Family History  Problem Relation Age of Onset  . Breast cancer Neg Hx     Allergies  Allergen Reactions  . Losartan     States worsened gout  . Ace  Inhibitors     Sinus issues  . Atorvastatin     Sinus side effects  . Chlorthalidone     Sinus issues  . Codeine Other (See Comments)    States she feels "funny " in the head    CBC Latest Ref Rng & Units 08/02/2019 01/16/2016  WBC 4.0 - 10.5 K/uL 6.6 9.5  Hemoglobin 12.0 - 15.0 g/dL 7.9(L) 8.0(L)  Hematocrit 36.0 - 46.0 % 24.3(L) 24.1(L)  Platelets 150 - 400 K/uL 249 298      CMP     Component Value Date/Time   NA 140 08/02/2019 1258   K 6.1 (H) 04/24/2020 0956   CL 107 08/02/2019 1258   CO2 21 (L) 08/02/2019 1258   GLUCOSE 136 (H) 08/02/2019 1258   BUN 46 (H) 08/02/2019 1258   CREATININE 4.88 (H) 08/02/2019 1258   CALCIUM 9.2 08/02/2019 1258   PROT 7.2 01/16/2016 1942   ALBUMIN 3.2 (L) 01/16/2016 1942   AST 13 (L) 01/16/2016 1942   ALT 8 (L) 01/16/2016 1942   ALKPHOS 67 01/16/2016 1942   BILITOT <0.1 (L) 01/16/2016 1942   GFRNONAA 8 (L) 08/02/2019 1258   GFRAA 10 (L) 08/02/2019 1258     No results found.     Assessment & Plan:   1. ESRD (end stage renal disease) ([place]) Recommend:  The patient is doing well and currently has adequate dialysis access. The patient's dialysis center is not reporting any access issues. Flow pattern is stable when compared to the prior ultrasound.  The patient should have a duplex ultrasound of the dialysis access in 6 months. The patient will follow-up with me in the office after each ultrasound     2. Primary hypertension Continue antihypertensive medications as already ordered, these medications have been reviewed and there are no changes at this time.   3. Type 2 diabetes mellitus without complication, without long-term current use of insulin (HCC) Continue hypoglycemic medications as already ordered, these medications have been reviewed and there are no changes at this time.  Hgb A1C to be monitored as already arranged by primary service    Current Outpatient Medications on File Prior to Visit  Medication Sig Dispense  Refill  . acetaminophen (TYLENOL) 500 MG tablet Take 500 mg by mouth every 6 (six) hours as needed.    Marland Kitchen allopurinol (ZYLOPRIM) 100 MG tablet Take 100 mg by mouth daily. Takes in the morning.    Marland Kitchen amLODipine (NORVASC) 10 MG tablet Take 10 mg by mouth daily. In the morning    . Cholecalciferol (VITAMIN D3) 2000  units capsule Take 2,000 Units by mouth daily.    . cinacalcet (SENSIPAR) 30 MG tablet Take 30 mg by mouth daily.    . ferrous sulfate 325 (65 FE) MG tablet Take 325 mg by mouth daily with breakfast.     . fluticasone (FLONASE) 50 MCG/ACT nasal spray     . furosemide (LASIX) 20 MG tablet Take 20 mg by mouth daily.    Marland Kitchen glipiZIDE (GLUCOTROL XL) 10 MG 24 hr tablet Take by mouth. Patient has not taken in 6-7 months    . hydrALAZINE (APRESOLINE) 25 MG tablet Take 25 mg by mouth 3 (three) times daily.     . hydrochlorothiazide (HYDRODIURIL) 12.5 MG tablet Take 1 tablet (12.5 mg total) by mouth daily. 10 tablet 0  . isosorbide mononitrate (IMDUR) 30 MG 24 hr tablet Take 30 mg by mouth daily.    Marland Kitchen lidocaine-prilocaine (EMLA) cream Apply topically.    . metoprolol succinate (TOPROL-XL) 25 MG 24 hr tablet Take 25 mg by mouth daily.     . rosuvastatin (CRESTOR) 20 MG tablet Take 20 mg by mouth daily. In the morning    . sevelamer carbonate (RENVELA) 800 MG tablet Take 800 mg by mouth 3 (three) times daily with meals.    . sodium bicarbonate 650 MG tablet Take 650 mg by mouth 2 (two) times daily.     No current facility-administered medications on file prior to visit.    There are no Patient Instructions on file for this visit. No follow-ups on file.   Kris Hartmann, NP

## 2021-02-28 ENCOUNTER — Telehealth: Payer: Self-pay

## 2021-02-28 NOTE — Telephone Encounter (Signed)
1225 pm.  Call back to patient made at the request of Ralene Bathe, NP.  Patient states she missed her last visit with NP and would like to schedule.  Advised patient that I would have our scheduler follow up.  Message sent to Palliative Care admin requesting a follow up call to schedule an appointment.

## 2021-07-30 ENCOUNTER — Other Ambulatory Visit: Payer: Self-pay

## 2021-07-30 ENCOUNTER — Ambulatory Visit
Admission: RE | Admit: 2021-07-30 | Discharge: 2021-07-30 | Disposition: A | Discharge status: Home / Self Care | Payer: Medicare PPO | Source: Ambulatory Visit | Attending: Student | Admitting: Student

## 2021-07-30 ENCOUNTER — Other Ambulatory Visit: Payer: Self-pay | Admitting: Student

## 2021-07-30 ENCOUNTER — Ambulatory Visit
Admission: RE | Admit: 2021-07-30 | Discharge: 2021-07-30 | Disposition: A | Discharge status: Home / Self Care | Payer: Medicare PPO | Attending: Student | Admitting: Student

## 2021-07-30 DIAGNOSIS — G8929 Other chronic pain: Secondary | ICD-10-CM

## 2021-07-30 DIAGNOSIS — M25561 Pain in right knee: Secondary | ICD-10-CM

## 2021-08-06 ENCOUNTER — Ambulatory Visit (INDEPENDENT_AMBULATORY_CARE_PROVIDER_SITE_OTHER): Payer: Medicare PPO | Admitting: Nurse Practitioner

## 2021-08-06 ENCOUNTER — Other Ambulatory Visit: Payer: Self-pay

## 2021-08-06 ENCOUNTER — Other Ambulatory Visit (INDEPENDENT_AMBULATORY_CARE_PROVIDER_SITE_OTHER): Payer: Self-pay | Admitting: Nurse Practitioner

## 2021-08-06 ENCOUNTER — Ambulatory Visit (INDEPENDENT_AMBULATORY_CARE_PROVIDER_SITE_OTHER): Payer: Medicare PPO

## 2021-08-06 ENCOUNTER — Encounter (INDEPENDENT_AMBULATORY_CARE_PROVIDER_SITE_OTHER): Payer: Self-pay | Admitting: Nurse Practitioner

## 2021-08-06 ENCOUNTER — Encounter (INDEPENDENT_AMBULATORY_CARE_PROVIDER_SITE_OTHER): Payer: Medicare PPO

## 2021-08-06 VITALS — BP 138/65 | HR 57 | Resp 16 | Wt 137.4 lb

## 2021-08-06 DIAGNOSIS — E119 Type 2 diabetes mellitus without complications: Secondary | ICD-10-CM | POA: Diagnosis not present

## 2021-08-06 DIAGNOSIS — I10 Essential (primary) hypertension: Secondary | ICD-10-CM | POA: Diagnosis not present

## 2021-08-06 DIAGNOSIS — N186 End stage renal disease: Secondary | ICD-10-CM

## 2021-08-07 ENCOUNTER — Encounter (INDEPENDENT_AMBULATORY_CARE_PROVIDER_SITE_OTHER): Payer: Self-pay | Admitting: Nurse Practitioner

## 2021-08-07 NOTE — Progress Notes (Signed)
Subjective:    Patient ID: Shelby Galloway, female    DOB: 24-Oct-1945, 76 y.o.   MRN: [id number] Chief Complaint  Patient presents with   Follow-up    Ultrasound follow up    Shelby Galloway is a 76 year old female that returns to the office for followup of their dialysis access. The function of the access has been stable. The patient denies increased bleeding time or increased recirculation. Patient denies difficulty with cannulation. The patient denies hand pain or other symptoms consistent with steal phenomena.  No significant arm swelling.  The patient denies redness or swelling at the access site. The patient denies fever or chills at home or while on dialysis.  The patient denies amaurosis fugax or recent TIA symptoms. There are no recent neurological changes noted. The patient denies claudication symptoms or rest pain symptoms. The patient denies history of DVT, PE or superficial thrombophlebitis. The patient denies recent episodes of angina or shortness of breath.    The patient has a flow volume of 917.  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      Review of Systems  Musculoskeletal:  Positive for gait problem.  All other systems reviewed and are negative.     Objective:   Physical Exam Vitals reviewed.  HENT:     Head: Normocephalic.  Cardiovascular:     Rate and Rhythm: Normal rate.     Pulses: Normal pulses.     Arteriovenous access: Left arteriovenous access is present.    Comments: Decreased thrill and bruit Pulmonary:     Effort: Pulmonary effort is normal.  Skin:    General: Skin is warm and dry.  Neurological:     Mental Status: She is alert and oriented to person, place, and time.  Psychiatric:        Mood and Affect: Mood normal.        Behavior: Behavior normal.        Thought Content: Thought content normal.        Judgment: Judgment normal.    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  Tobacco Use   Smoking status: Former    Packs/day: 1.00    Types: Cigarettes    Quit date: 2010    Years since quitting: 13.0   Smokeless tobacco: Never  Vaping Use   Vaping Use: Never used  Substance and Sexual Activity   Alcohol use: No   Drug use: Never   Sexual activity: Not Currently  Other Topics Concern   Not on file  Social History Narrative   Not on file   Social Determinants of Health   Financial Resource Strain: Not on file  Food Insecurity: Not  on file  Transportation Needs: Not on file  Physical Activity: Not on file  Stress: Not on file  Social Connections: Not on file  Intimate Partner Violence: Not on file    Past Surgical History:  Procedure Laterality Date   A/V FISTULAGRAM Left 04/24/2020   Procedure: A/V FISTULAGRAM;  Surgeon: Algernon Huxley, MD;  Location: [facility] [facility];  Service: Cardiovascular;  Laterality: Left;   A/V FISTULAGRAM Left 12/25/2020   Procedure: A/V FISTULAGRAM;  Surgeon: Algernon Huxley, MD;  Location: [place] [facility];  Service: Cardiovascular;  Laterality: Left;   ABDOMINAL HYSTERECTOMY     AV FISTULA PLACEMENT Left 08/04/2019   Procedure: INSERTION OF ARTERIOVENOUS (AV) GORE-TEX GRAFT ARM;  Surgeon: Algernon Huxley, MD;  Location: [facility];  Service: Vascular;  Laterality: Left;    Family History  Problem Relation Age of Onset   Breast cancer Neg Hx     Allergies  Allergen Reactions    Losartan     States worsened gout   Ace Inhibitors     Sinus issues   Atorvastatin     Sinus side effects   Chlorthalidone     Sinus issues   Codeine Other (See Comments)    States she feels "funny " in the head    CBC Latest Ref Rng & Units 08/02/2019 01/16/2016  WBC 4.0 - 10.5 K/uL 6.6 9.5  Hemoglobin 12.0 - 15.0 g/dL 7.9(L) 8.0(L)  Hematocrit 36.0 - 46.0 % 24.3(L) 24.1(L)  Platelets 150 - 400 K/uL 249 298      CMP     Component Value Date/Time   NA 140 08/02/2019 1258   K 6.1 (H) 04/24/2020 0956   CL 107 08/02/2019 1258   CO2 21 (L) 08/02/2019 1258   GLUCOSE 136 (H) 08/02/2019 1258   BUN 46 (H) 08/02/2019 1258   CREATININE 4.88 (H) 08/02/2019 1258   CALCIUM 9.2 08/02/2019 1258   PROT 7.2 01/16/2016 1942   ALBUMIN 3.2 (L) 01/16/2016 1942   AST 13 (L) 01/16/2016 1942   ALT 8 (L) 01/16/2016 1942   ALKPHOS 67 01/16/2016 1942   BILITOT <0.1 (L) 01/16/2016 1942   GFRNONAA 8 (L) 08/02/2019 1258   GFRAA 10 (L) 08/02/2019 1258     No results found.     Assessment & Plan:   1. ESRD (end stage renal disease) ([place]) Recommend:  The patient is experiencing increasing problems with their dialysis access.  Patient should have a fistulagram with the intention for intervention.  The intention for intervention is to restore appropriate flow and prevent thrombosis and possible loss of the access.  As well as improve the quality of dialysis therapy.  The risks, benefits and alternative therapies were reviewed in detail with the patient.  All questions were answered.  The patient agrees to proceed with angio/intervention.      2. Primary hypertension Continue antihypertensive medications as already ordered, these medications have been reviewed and there are no changes at this time.   3. Type 2 diabetes mellitus without complication, without long-term current use of insulin (HCC) Continue hypoglycemic medications as already ordered, these medications have been reviewed and  there are no changes at this time.  Hgb A1C to be monitored as already arranged by primary service    Current Outpatient Medications on File Prior to Visit  Medication Sig Dispense Refill   acetaminophen (TYLENOL) 500 MG tablet Take 500 mg by mouth every 6 (six) hours as needed.     allopurinol (  ZYLOPRIM) 100 MG tablet Take 100 mg by mouth daily. Takes in the morning.     amLODipine (NORVASC) 10 MG tablet Take 10 mg by mouth daily. In the morning     Cholecalciferol (VITAMIN D3) 2000 units capsule Take 2,000 Units by mouth daily.     cinacalcet (SENSIPAR) 30 MG tablet Take 30 mg by mouth daily.     ferrous sulfate 325 (65 FE) MG tablet Take 325 mg by mouth daily with breakfast.      fluticasone (FLONASE) 50 MCG/ACT nasal spray      furosemide (LASIX) 20 MG tablet Take 20 mg by mouth daily.     glipiZIDE (GLUCOTROL XL) 10 MG 24 hr tablet Take by mouth. Patient has not taken in 6-7 months     hydrALAZINE (APRESOLINE) 25 MG tablet Take 25 mg by mouth 3 (three) times daily.      hydrochlorothiazide (HYDRODIURIL) 12.5 MG tablet Take 1 tablet (12.5 mg total) by mouth daily. 10 tablet 0   isosorbide mononitrate (IMDUR) 30 MG 24 hr tablet Take 30 mg by mouth daily.     metoprolol succinate (TOPROL-XL) 25 MG 24 hr tablet Take 25 mg by mouth daily.      rosuvastatin (CRESTOR) 20 MG tablet Take 20 mg by mouth daily. In the morning     sevelamer carbonate (RENVELA) 800 MG tablet Take 800 mg by mouth 3 (three) times daily with meals.     sodium bicarbonate 650 MG tablet Take 650 mg by mouth 2 (two) times daily.     No current facility-administered medications on file prior to visit.    There are no Patient Instructions on file for this visit. No follow-ups on file.   Kris Hartmann, NP

## 2022-02-07 ENCOUNTER — Other Ambulatory Visit (INDEPENDENT_AMBULATORY_CARE_PROVIDER_SITE_OTHER): Payer: Self-pay | Admitting: Nurse Practitioner

## 2022-02-07 DIAGNOSIS — N186 End stage renal disease: Secondary | ICD-10-CM

## 2022-02-08 ENCOUNTER — Ambulatory Visit (INDEPENDENT_AMBULATORY_CARE_PROVIDER_SITE_OTHER): Payer: Medicare PPO

## 2022-02-08 ENCOUNTER — Ambulatory Visit (INDEPENDENT_AMBULATORY_CARE_PROVIDER_SITE_OTHER): Payer: Medicare PPO | Admitting: Nurse Practitioner

## 2022-02-08 DIAGNOSIS — N186 End stage renal disease: Secondary | ICD-10-CM

## 2022-02-08 DIAGNOSIS — E119 Type 2 diabetes mellitus without complications: Secondary | ICD-10-CM

## 2022-02-08 DIAGNOSIS — I10 Essential (primary) hypertension: Secondary | ICD-10-CM

## 2022-02-13 ENCOUNTER — Telehealth (INDEPENDENT_AMBULATORY_CARE_PROVIDER_SITE_OTHER): Payer: Self-pay

## 2022-02-13 NOTE — Telephone Encounter (Addendum)
I attempted to contact the patient to schedule a right arm venogram with Dr. Lucky Cowboy. A message was left for a return call. Patient called back and is scheduled on 02/18/22 with a 10:45 am arrival time to the [facility]. Pre-procedure instructions were discussed and will be mailed.

## 2022-02-18 ENCOUNTER — Encounter (INDEPENDENT_AMBULATORY_CARE_PROVIDER_SITE_OTHER): Payer: Self-pay | Admitting: Nurse Practitioner

## 2022-02-18 ENCOUNTER — Other Ambulatory Visit: Payer: Self-pay

## 2022-02-18 ENCOUNTER — Encounter
Admission: RE | Disposition: A | Discharge status: Home / Self Care | Payer: Self-pay | Source: Home / Self Care | Attending: Vascular Surgery

## 2022-02-18 ENCOUNTER — Ambulatory Visit
Admission: RE | Admit: 2022-02-18 | Discharge: 2022-02-18 | Disposition: A | Discharge status: Home / Self Care | Payer: Medicare PPO | Attending: Vascular Surgery | Admitting: Vascular Surgery

## 2022-02-18 DIAGNOSIS — Z87891 Personal history of nicotine dependence: Secondary | ICD-10-CM | POA: Diagnosis not present

## 2022-02-18 DIAGNOSIS — Z992 Dependence on renal dialysis: Secondary | ICD-10-CM | POA: Insufficient documentation

## 2022-02-18 DIAGNOSIS — I120 Hypertensive chronic kidney disease with stage 5 chronic kidney disease or end stage renal disease: Secondary | ICD-10-CM | POA: Insufficient documentation

## 2022-02-18 DIAGNOSIS — E1122 Type 2 diabetes mellitus with diabetic chronic kidney disease: Secondary | ICD-10-CM | POA: Diagnosis not present

## 2022-02-18 DIAGNOSIS — N186 End stage renal disease: Secondary | ICD-10-CM | POA: Diagnosis not present

## 2022-02-18 DIAGNOSIS — T82898A Other specified complication of vascular prosthetic devices, implants and grafts, initial encounter: Secondary | ICD-10-CM | POA: Diagnosis not present

## 2022-02-18 DIAGNOSIS — Z9889 Other specified postprocedural states: Secondary | ICD-10-CM

## 2022-02-18 HISTORY — PX: UPPER EXTREMITY VENOGRAPHY: CATH118272

## 2022-02-18 LAB — POTASSIUM (ARMC VASCULAR LAB ONLY): Potassium (ARMC vascular lab): 4.6 mmol/L (ref 3.5–5.1)

## 2022-02-18 SURGERY — UPPER EXTREMITY VENOGRAPHY
Anesthesia: Moderate Sedation | Laterality: Right

## 2022-02-18 MED ORDER — MIDAZOLAM HCL 2 MG/2ML IJ SOLN
INTRAMUSCULAR | Status: AC
  Filled 2022-02-18: qty 2

## 2022-02-18 MED ORDER — FENTANYL CITRATE PF 50 MCG/ML IJ SOSY
PREFILLED_SYRINGE | INTRAMUSCULAR | Status: AC
  Filled 2022-02-18: qty 1

## 2022-02-18 MED ORDER — FENTANYL CITRATE (PF) 100 MCG/2ML IJ SOLN
INTRAMUSCULAR | Status: DC | PRN
  Administered 2022-02-18: 50 ug via INTRAVENOUS

## 2022-02-18 MED ORDER — HEPARIN SODIUM (PORCINE) 1000 UNIT/ML IJ SOLN
INTRAMUSCULAR | Status: DC
Start: 2022-02-18 — End: 2022-02-18
  Filled 2022-02-18: qty 10

## 2022-02-18 MED ORDER — METHYLPREDNISOLONE SODIUM SUCC 125 MG IJ SOLR: 125.0000 mg | Freq: Once | INTRAMUSCULAR | Status: DC | PRN

## 2022-02-18 MED ORDER — MIDAZOLAM HCL 2 MG/ML PO SYRP: 8.0000 mg | ORAL_SOLUTION | Freq: Once | ORAL | Status: DC | PRN

## 2022-02-18 MED ORDER — CEFAZOLIN SODIUM-DEXTROSE 1-4 GM/50ML-% IV SOLN: 1.0000 g | INTRAVENOUS | Status: AC

## 2022-02-18 MED ORDER — DIPHENHYDRAMINE HCL 50 MG/ML IJ SOLN: 50.0000 mg | Freq: Once | INTRAMUSCULAR | Status: DC | PRN

## 2022-02-18 MED ORDER — IODIXANOL 320 MG/ML IV SOLN
INTRAVENOUS | Status: DC | PRN
  Administered 2022-02-18: 10 mL via INTRAVENOUS

## 2022-02-18 MED ORDER — CEFAZOLIN SODIUM-DEXTROSE 1-4 GM/50ML-% IV SOLN
INTRAVENOUS | Status: AC
  Administered 2022-02-18: 1 g via INTRAVENOUS
  Filled 2022-02-18: qty 50

## 2022-02-18 MED ORDER — SODIUM CHLORIDE 0.9 % IV SOLN
INTRAVENOUS | Status: DC
  Administered 2022-02-18: 1000 mL via INTRAVENOUS

## 2022-02-18 MED ORDER — FAMOTIDINE 20 MG PO TABS: 40.0000 mg | ORAL_TABLET | Freq: Once | ORAL | Status: DC | PRN

## 2022-02-18 MED ORDER — HYDROMORPHONE HCL 1 MG/ML IJ SOLN: 1.0000 mg | Freq: Once | INTRAMUSCULAR | Status: DC | PRN

## 2022-02-18 MED ORDER — CEFAZOLIN SODIUM-DEXTROSE 2-4 GM/100ML-% IV SOLN: 2.0000 g | INTRAVENOUS | Status: DC

## 2022-02-18 MED ORDER — ONDANSETRON HCL 4 MG/2ML IJ SOLN: 4.0000 mg | Freq: Four times a day (QID) | INTRAMUSCULAR | Status: DC | PRN

## 2022-02-18 MED ORDER — MIDAZOLAM HCL 2 MG/2ML IJ SOLN
INTRAMUSCULAR | Status: DC | PRN
  Administered 2022-02-18 (×2): 1 mg via INTRAVENOUS

## 2022-02-18 SURGICAL SUPPLY — 6 items
CANNULA 5F STIFF (CANNULA) ×1 IMPLANT
COVER PROBE U/S 5X48 (MISCELLANEOUS) ×1 IMPLANT
DRAPE BRACHIAL (DRAPES) ×1 IMPLANT
PACK ANGIOGRAPHY (CUSTOM PROCEDURE TRAY) ×1 IMPLANT
SHEATH BRITE TIP 6FRX5.5 (SHEATH) ×1 IMPLANT
TOWEL OR 17X26 4PK STRL BLUE (TOWEL DISPOSABLE) ×1 IMPLANT

## 2022-02-18 NOTE — Progress Notes (Signed)
Dr. Lucky Cowboy came by pt. Shelby Galloway now to speak with pt. Shelby Galloway: post procedure results. Pt. Verbalized understanding of conversation. Dr. Bunnie Domino office to contact pt. This week to set up surgical date.

## 2022-02-18 NOTE — Op Note (Signed)
[facility]   OPERATIVE NOTE  DATE: 02/18/2022  PRE-OPERATIVE DIAGNOSIS: 1. ESRD 2. Difficult dialysis access  POST-OPERATIVE DIAGNOSIS: same  PROCEDURE: 1.   Ultrasound guidance for vascular access to left brachial vein 2.   Left upper extremity venogram and superior venacavogram   SURGEON: Leotis Pain, MD  ASSISTANT(S): None  ANESTHESIA: Local with moderate conscious sedation for approximately 24 minutes using 2 mg of Versed and 50 mcg of Fentanyl  ESTIMATED BLOOD LOSS: 2 cc  FINDING(S): 1.  This demonstrated the brachial vein to be patent in the distal upper arm and remain patent through the axillary vein. The subclavian vein, left innominate vein, and superior vena cava were also patent through the central venous circulation.  SPECIMEN(S):  None  INDICATIONS:   Patient is a 76 y.o.female who presents with a difficult dialysis access situation. The patient has a history of multiple access procedures, but now needs a new dialysis access. Upper extremity venograms are being performed to evaluate the adequacy of the upper extremities for an AV fistula or AV graft. Risks and benefits were discussed and informed consent was obtained.   DESCRIPTION: After obtaining full informed written consent, the patient was brought back to the vascular suite and placed in supine position with their arms extended bilaterally. Moderate conscious sedation was administered during a face to face encounter with the patient throughout the procedure with my supervision of the RN administering medicines and monitoring the patient's vital signs, pulse oximetry, telemetry and mental status throughout from the start of the procedure until the patient was taken to the recovery room.  After obtaining adequate anesthesia, the patient was prepped and draped in the standard fashion in the left upper extremity.   The left brachial vein was then identified with ultrasound and found to be patent.  This was accessed under direct ultrasound guidance with a micropuncture needle and a micropuncture wire and sheath were then placed.  Imaging was performed through the micropuncture sheath. This demonstrated the brachial vein to be patent in the distal upper arm and remain patent through the axillary vein. The subclavian vein, left innominate vein, and superior vena cava were also patent through the central venous circulation.  At this point, I elected to terminate the procedure. The sheath was removed and pressure were held at the access site in the left upper extremity. The patient was awakened from anesthesia and taken to the recovery room in stable condition.  COMPLICATIONS: None  CONDITION: Stable

## 2022-02-18 NOTE — H&P (View-Only) (Signed)
Subjective:    Patient ID: Shelby Galloway, female    DOB: May 25, 1946, 76 y.o.   MRN: [id number] No chief complaint on file.   The patient presents today for follow-up evaluation for upper extremity access.  She previously had a left AV fistula but this became nonfunctional about 2 months ago.  The patient underwent vein mapping today which shows adequate access for right brachial axillary AV graft.  However, the patient does seem to have some noted flow near the antecubital fossa of her left fistula.    Review of Systems  All other systems reviewed and are negative.      Objective:   Physical Exam Vitals reviewed.  HENT:     Head: Normocephalic.  Cardiovascular:     Rate and Rhythm: Normal rate.     Pulses: Normal pulses.  Pulmonary:     Effort: Pulmonary effort is normal.  Skin:    General: Skin is warm and dry.  Neurological:     Mental Status: She is alert and oriented to person, place, and time.  Psychiatric:        Mood and Affect: Mood normal.        Behavior: Behavior normal.        Thought Content: Thought content normal.        Judgment: Judgment normal.     There were no vitals taken for this visit.  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  Tobacco Use   Smoking status: Former    Packs/day: 1.00    Types: Cigarettes    Quit date: 2010    Years since quitting: 13.5   Smokeless tobacco: Never  Vaping Use   Vaping Use: Never used  Substance and Sexual Activity   Alcohol use: No   Drug use: Never   Sexual activity: Not Currently   Other Topics Concern   Not on file  Social History Narrative   Not on file   Social Determinants of Health   Financial Resource Strain: Not on file  Food Insecurity: Not on file  Transportation Needs: Not on file  Physical Activity: Not on file  Stress: Not on file  Social Connections: Not on file  Intimate Partner Violence: Not on file    Past Surgical History:  Procedure Laterality Date   A/V FISTULAGRAM Left 04/24/2020   Procedure: A/V FISTULAGRAM;  Surgeon: Algernon Huxley, MD;  Location: [facility];  Service: Cardiovascular;  Laterality: Left;   A/V FISTULAGRAM Left 12/25/2020   Procedure: A/V FISTULAGRAM;  Surgeon: Algernon Huxley, MD;  Location: [facility];  Service: Cardiovascular;  Laterality: Left;   ABDOMINAL HYSTERECTOMY     AV FISTULA PLACEMENT Left 08/04/2019   Procedure: INSERTION OF ARTERIOVENOUS (AV) GORE-TEX GRAFT ARM;  Surgeon: Algernon Huxley, MD;  Location: [facility];  Service: Vascular;  Laterality: Left;    Family History  Problem Relation Age of Onset   Breast cancer Neg  Hx     Allergies  Allergen Reactions   Losartan     States worsened gout   Ace Inhibitors     Sinus issues   Atorvastatin     Sinus side effects   Chlorthalidone     Sinus issues   Codeine Other (See Comments)    States she feels "funny " in the head       Latest Ref Rng & Units 08/02/2019   12:58 PM 01/16/2016    7:42 PM  CBC  WBC 4.0 - 10.5 K/uL 6.6  9.5   Hemoglobin 12.0 - 15.0 g/dL 7.9  8.0   Hematocrit 36.0 - 46.0 % 24.3  24.1   Platelets 150 - 400 K/uL 249  298       CMP     Component Value Date/Time   NA 140 08/02/2019 1258   K 6.1 (H) 04/24/2020 0956   CL 107 08/02/2019 1258   CO2 21 (L) 08/02/2019 1258   GLUCOSE 136 (H) 08/02/2019 1258   BUN 46 (H) 08/02/2019 1258   CREATININE 4.88 (H) 08/02/2019 1258   CALCIUM 9.2 08/02/2019 1258   PROT 7.2 01/16/2016 1942   ALBUMIN 3.2 (L) 01/16/2016 1942   AST 13 (L) 01/16/2016 1942   ALT 8 (L)  01/16/2016 1942   ALKPHOS 67 01/16/2016 1942   BILITOT <0.1 (L) 01/16/2016 1942   GFRNONAA 8 (L) 08/02/2019 1258   GFRAA 10 (L) 08/02/2019 1258     No results found.     Assessment & Plan:   1. ESRD (end stage renal disease) ([place]) I discussed upper extremity access options with the patient.  If we wish to maintain access in her left upper extremity she would need to undergo a venogram to determine if will be possible to place a graft.  The benefits of this are that her right upper extremity is left open and available if she should need another access in the future.  For we can proceed with a graft in the right upper extremity.  Following our discussion including the risk benefits and alternatives, the patient agrees to proceed with a left upper extremity venogram to evaluate for possible graft placement in this arm.  She does understand that if there is no available access sites we will need to proceed with a right upper extremity brachial ax graft.  2. Primary hypertension Continue antihypertensive medications as already ordered, these medications have been reviewed and there are no changes at this time.   3. Type 2 diabetes mellitus without complication, without long-term current use of insulin (HCC) Continue hypoglycemic medications as already ordered, these medications have been reviewed and there are no changes at this time.  Hgb A1C to be monitored as already arranged by primary service    Current Outpatient Medications on File Prior to Visit  Medication Sig Dispense Refill   acetaminophen (TYLENOL) 500 MG tablet Take 500 mg by mouth every 6 (six) hours as needed.     amLODipine (NORVASC) 10 MG tablet Take 10 mg by mouth daily. In the morning     cinacalcet (SENSIPAR) 90 MG tablet Take by mouth.     fluticasone (FLONASE) 50 MCG/ACT nasal spray      hydrochlorothiazide (HYDRODIURIL) 12.5 MG tablet Take 1 tablet (12.5 mg total) by mouth daily. 10 tablet 0   isosorbide mononitrate  (IMDUR) 30 MG 24 hr tablet Take 30 mg by mouth daily.     Methoxy PEG-Epoetin Beta (MIRCERA IJ) Mircera  sevelamer carbonate (RENVELA) 800 MG tablet Take 800 mg by mouth 3 (three) times daily with meals.     No current facility-administered medications on file prior to visit.    There are no Patient Instructions on file for this visit. No follow-ups on file.   Kris Hartmann, NP

## 2022-02-18 NOTE — Progress Notes (Signed)
Subjective:    Patient ID: Shelby Galloway, female    DOB: October 04, 1945, 76 y.o.   MRN: [id number] No chief complaint on file.   The patient presents today for follow-up evaluation for upper extremity access.  She previously had a left AV fistula but this became nonfunctional about 2 months ago.  The patient underwent vein mapping today which shows adequate access for right brachial axillary AV graft.  However, the patient does seem to have some noted flow near the antecubital fossa of her left fistula.    Review of Systems  All other systems reviewed and are negative.      Objective:   Physical Exam Vitals reviewed.  HENT:     Head: Normocephalic.  Cardiovascular:     Rate and Rhythm: Normal rate.     Pulses: Normal pulses.  Pulmonary:     Effort: Pulmonary effort is normal.  Skin:    General: Skin is warm and dry.  Neurological:     Mental Status: She is alert and oriented to person, place, and time.  Psychiatric:        Mood and Affect: Mood normal.        Behavior: Behavior normal.        Thought Content: Thought content normal.        Judgment: Judgment normal.     There were no vitals taken for this visit.  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  Tobacco Use   Smoking status: Former    Packs/day: 1.00    Types: Cigarettes    Quit date: 2010    Years since quitting: 13.5   Smokeless tobacco: Never  Vaping Use   Vaping Use: Never used  Substance and Sexual Activity   Alcohol use: No   Drug use: Never   Sexual activity: Not Currently   Other Topics Concern   Not on file  Social History Narrative   Not on file   Social Determinants of Health   Financial Resource Strain: Not on file  Food Insecurity: Not on file  Transportation Needs: Not on file  Physical Activity: Not on file  Stress: Not on file  Social Connections: Not on file  Intimate Partner Violence: Not on file    Past Surgical History:  Procedure Laterality Date   A/V FISTULAGRAM Left 04/24/2020   Procedure: A/V FISTULAGRAM;  Surgeon: Algernon Huxley, MD;  Location: [facility];  Service: Cardiovascular;  Laterality: Left;   A/V FISTULAGRAM Left 12/25/2020   Procedure: A/V FISTULAGRAM;  Surgeon: Algernon Huxley, MD;  Location: [facility];  Service: Cardiovascular;  Laterality: Left;   ABDOMINAL HYSTERECTOMY     AV FISTULA PLACEMENT Left 08/04/2019   Procedure: INSERTION OF ARTERIOVENOUS (AV) GORE-TEX GRAFT ARM;  Surgeon: Algernon Huxley, MD;  Location: [facility];  Service: Vascular;  Laterality: Left;    Family History  Problem Relation Age of Onset   Breast cancer Neg  Hx     Allergies  Allergen Reactions   Losartan     States worsened gout   Ace Inhibitors     Sinus issues   Atorvastatin     Sinus side effects   Chlorthalidone     Sinus issues   Codeine Other (See Comments)    States she feels "funny " in the head       Latest Ref Rng & Units 08/02/2019   12:58 PM 01/16/2016    7:42 PM  CBC  WBC 4.0 - 10.5 K/uL 6.6  9.5   Hemoglobin 12.0 - 15.0 g/dL 7.9  8.0   Hematocrit 36.0 - 46.0 % 24.3  24.1   Platelets 150 - 400 K/uL 249  298       CMP     Component Value Date/Time   NA 140 08/02/2019 1258   K 6.1 (H) 04/24/2020 0956   CL 107 08/02/2019 1258   CO2 21 (L) 08/02/2019 1258   GLUCOSE 136 (H) 08/02/2019 1258   BUN 46 (H) 08/02/2019 1258   CREATININE 4.88 (H) 08/02/2019 1258   CALCIUM 9.2 08/02/2019 1258   PROT 7.2 01/16/2016 1942   ALBUMIN 3.2 (L) 01/16/2016 1942   AST 13 (L) 01/16/2016 1942   ALT 8 (L)  01/16/2016 1942   ALKPHOS 67 01/16/2016 1942   BILITOT <0.1 (L) 01/16/2016 1942   GFRNONAA 8 (L) 08/02/2019 1258   GFRAA 10 (L) 08/02/2019 1258     No results found.     Assessment & Plan:   1. ESRD (end stage renal disease) ([place]) I discussed upper extremity access options with the patient.  If we wish to maintain access in her left upper extremity she would need to undergo a venogram to determine if will be possible to place a graft.  The benefits of this are that her right upper extremity is left open and available if she should need another access in the future.  For we can proceed with a graft in the right upper extremity.  Following our discussion including the risk benefits and alternatives, the patient agrees to proceed with a left upper extremity venogram to evaluate for possible graft placement in this arm.  She does understand that if there is no available access sites we will need to proceed with a right upper extremity brachial ax graft.  2. Primary hypertension Continue antihypertensive medications as already ordered, these medications have been reviewed and there are no changes at this time.   3. Type 2 diabetes mellitus without complication, without long-term current use of insulin (HCC) Continue hypoglycemic medications as already ordered, these medications have been reviewed and there are no changes at this time.  Hgb A1C to be monitored as already arranged by primary service    Current Outpatient Medications on File Prior to Visit  Medication Sig Dispense Refill   acetaminophen (TYLENOL) 500 MG tablet Take 500 mg by mouth every 6 (six) hours as needed.     amLODipine (NORVASC) 10 MG tablet Take 10 mg by mouth daily. In the morning     cinacalcet (SENSIPAR) 90 MG tablet Take by mouth.     fluticasone (FLONASE) 50 MCG/ACT nasal spray      hydrochlorothiazide (HYDRODIURIL) 12.5 MG tablet Take 1 tablet (12.5 mg total) by mouth daily. 10 tablet 0   isosorbide mononitrate  (IMDUR) 30 MG 24 hr tablet Take 30 mg by mouth daily.     Methoxy PEG-Epoetin Beta (MIRCERA IJ) Mircera  sevelamer carbonate (RENVELA) 800 MG tablet Take 800 mg by mouth 3 (three) times daily with meals.     No current facility-administered medications on file prior to visit.    There are no Patient Instructions on file for this visit. No follow-ups on file.   Kris Hartmann, NP

## 2022-02-18 NOTE — Interval H&P Note (Signed)
History and Physical Interval Note:  02/18/2022 12:23 PM  Shelby Galloway  has presented today for surgery, with the diagnosis of R arm venogram   End Stage Renal.  The various methods of treatment have been discussed with the patient and family. After consideration of risks, benefits and other options for treatment, the patient has consented to  Procedure(s): UPPER EXTREMITY VENOGRAPHY (Right) as a surgical intervention.  The patient's history has been reviewed, patient examined, no change in status, stable for surgery.  I have reviewed the patient's chart and labs.  Questions were answered to the patient's satisfaction.     Leotis Pain

## 2022-02-19 ENCOUNTER — Encounter: Payer: Self-pay | Admitting: Vascular Surgery

## 2022-03-08 ENCOUNTER — Other Ambulatory Visit: Payer: Self-pay

## 2022-03-08 ENCOUNTER — Encounter: Payer: Self-pay | Admitting: Vascular Surgery

## 2022-03-08 NOTE — Progress Notes (Signed)
Pt completed pre-employment UDS. Hr,Notified.

## 2022-04-08 ENCOUNTER — Telehealth (INDEPENDENT_AMBULATORY_CARE_PROVIDER_SITE_OTHER): Payer: Self-pay

## 2022-04-08 NOTE — Telephone Encounter (Signed)
We can get her scheduled for a left brachial axillary AV graft with Dew, unless she has questions in which she can be scheduled to follow-up with either 1 of [place] for discussion.

## 2022-04-08 NOTE — Telephone Encounter (Signed)
Mitch called on behalf of pt wanting to know when she could get access placement? Please advise

## 2022-04-09 ENCOUNTER — Telehealth (INDEPENDENT_AMBULATORY_CARE_PROVIDER_SITE_OTHER): Payer: Self-pay

## 2022-04-09 NOTE — Telephone Encounter (Signed)
Spoke with the patient and she is scheduled with Dr. Lucky Cowboy for a left brachial axillary graft on 04/25/22 at the [facility]. Pre-op phone call is on 04/17/22 between 8-1. Pre-surgical instructions were discussed and will be mailed.

## 2022-04-17 ENCOUNTER — Other Ambulatory Visit (INDEPENDENT_AMBULATORY_CARE_PROVIDER_SITE_OTHER): Payer: Self-pay | Admitting: Nurse Practitioner

## 2022-04-17 ENCOUNTER — Encounter
Admission: RE | Admit: 2022-04-17 | Discharge: 2022-04-17 | Disposition: A | Discharge status: Home / Self Care | Payer: Medicare PPO | Source: Ambulatory Visit | Attending: Vascular Surgery | Admitting: Vascular Surgery

## 2022-04-17 DIAGNOSIS — N186 End stage renal disease: Secondary | ICD-10-CM

## 2022-04-17 DIAGNOSIS — Z01818 Encounter for other preprocedural examination: Secondary | ICD-10-CM

## 2022-04-17 HISTORY — DX: Dependence on renal dialysis: Z99.2

## 2022-04-17 HISTORY — DX: End stage renal disease: N18.6

## 2022-04-17 HISTORY — DX: Secondary hyperparathyroidism of renal origin: N25.81

## 2022-04-17 HISTORY — DX: Deficiency of other specified B group vitamins: E53.8

## 2022-04-17 HISTORY — DX: Anemia, unspecified: D64.9

## 2022-04-17 NOTE — Patient Instructions (Addendum)
Your procedure is scheduled on:04-25-22 Thursday Report to the Registration Desk on the 1st floor of the [facility].Then proceed to the 2nd floor Surgery Desk To find out your arrival time, please call [phone] between 1PM - 3PM on:04-24-22 Wednesday If your arrival time is 6:00 am, do not arrive prior to that time as the [facility] [place] entrance doors do not open until 6:00 am.  REMEMBER: Instructions that are not followed completely may result in serious medical risk, up to and including death; or upon the discretion of your surgeon and anesthesiologist your surgery may need to be rescheduled.  Do not eat food OR drink any liquids after midnight the night before surgery.  No gum chewing, lozengers or hard candies.  TAKE THESE MEDICATIONS THE MORNING OF SURGERY WITH A SIP OF WATER: -allopurinol (ZYLOPRIM)  -hydrALAZINE (APRESOLINE) -isosorbide mononitrate (IMDUR)  One week prior to surgery: Stop Anti-inflammatories (NSAIDS) such as Advil, Aleve, Ibuprofen, Motrin, Naproxen, Naprosyn and Aspirin based products such as Excedrin, Goodys Powder, BC Powder.You may however, continue to take Tylenol if needed for pain up until the day of surgery.  Stop ANY OVER THE COUNTER supplements/vitamins NOW (04-17-22) until after surgery.  No Alcohol for 24 hours before or after surgery.  No Smoking including e-cigarettes for 24 hours prior to surgery.  No chewable tobacco products for at least 6 hours prior to surgery.  No nicotine patches on the day of surgery.  Do not use any "recreational" drugs for at least a week prior to your surgery.  Please be advised that the combination of cocaine and anesthesia may have negative outcomes, up to and including death. If you test positive for cocaine, your surgery will be cancelled.  On the morning of surgery brush your teeth with toothpaste and water, you may rinse your mouth with mouthwash if you wish. Do not swallow any toothpaste or mouthwash.  Use  CHG wipes as directed on instruction sheet.  Do not wear jewelry, make-up, hairpins, clips or nail polish.  Do not wear lotions, powders, or perfumes.   Do not shave body from the neck down 48 hours prior to surgery just in case you cut yourself which could leave a site for infection.  Also, freshly shaved skin may become irritated if using the CHG soap.  Contact lenses, hearing aids and dentures may not be worn into surgery.  Do not bring valuables to the hospital. [facility] is not responsible for any missing/lost belongings or valuables.   Notify your doctor if there is any change in your medical condition (cold, fever, infection).  Wear comfortable clothing (specific to your surgery type) to the hospital.  After surgery, you can help prevent lung complications by doing breathing exercises.  Take deep breaths and cough every 1-2 hours. Your doctor may order a device called an Incentive Spirometer to help you take deep breaths. When coughing or sneezing, hold a pillow firmly against your incision with both hands. This is called "splinting." Doing this helps protect your incision. It also decreases belly discomfort.  If you are being admitted to the hospital overnight, leave your suitcase in the car. After surgery it may be brought to your room.  If you are being discharged the day of surgery, you will not be allowed to drive home. You will need a responsible adult (18 years or older) to drive you home and stay with you that night.   If you are taking public transportation, you will need to have a responsible adult (  18 years or older) with you. Please confirm with your physician that it is acceptable to use public transportation.   Please call the [facility] at [phone] if you have any questions about these instructions.  Surgery Visitation Policy:  Patients undergoing a surgery or procedure may have two family members or support persons with them as long  as the person is not COVID-19 positive or experiencing its symptoms.

## 2022-04-19 ENCOUNTER — Inpatient Hospital Stay: Admission: RE | Admit: 2022-04-19 | Payer: Medicare PPO | Source: Ambulatory Visit

## 2022-04-24 ENCOUNTER — Telehealth (INDEPENDENT_AMBULATORY_CARE_PROVIDER_SITE_OTHER): Payer: Self-pay

## 2022-04-24 ENCOUNTER — Encounter: Payer: Self-pay | Admitting: Urgent Care

## 2022-04-24 ENCOUNTER — Encounter
Admission: RE | Admit: 2022-04-24 | Discharge: 2022-04-24 | Disposition: A | Discharge status: Home / Self Care | Payer: Medicare PPO | Source: Ambulatory Visit | Attending: Vascular Surgery | Admitting: Vascular Surgery

## 2022-04-24 DIAGNOSIS — N186 End stage renal disease: Secondary | ICD-10-CM | POA: Diagnosis not present

## 2022-04-24 DIAGNOSIS — Z01818 Encounter for other preprocedural examination: Secondary | ICD-10-CM | POA: Diagnosis present

## 2022-04-24 DIAGNOSIS — Z01810 Encounter for preprocedural cardiovascular examination: Secondary | ICD-10-CM | POA: Diagnosis not present

## 2022-04-24 LAB — CBC WITH DIFFERENTIAL/PLATELET
Abs Immature Granulocytes: 0.01 10*3/uL (ref 0.00–0.07)
Basophils Absolute: 0.1 10*3/uL (ref 0.0–0.1)
Basophils Relative: 1 %
Eosinophils Absolute: 0.2 10*3/uL (ref 0.0–0.5)
Eosinophils Relative: 3 %
HCT: 33.9 % — ABNORMAL LOW (ref 36.0–46.0)
Hemoglobin: 10.7 g/dL — ABNORMAL LOW (ref 12.0–15.0)
Immature Granulocytes: 0 %
Lymphocytes Relative: 22 %
Lymphs Abs: 1.2 10*3/uL (ref 0.7–4.0)
MCH: 28.5 pg (ref 26.0–34.0)
MCHC: 31.6 g/dL (ref 30.0–36.0)
MCV: 90.2 fL (ref 80.0–100.0)
Monocytes Absolute: 0.6 10*3/uL (ref 0.1–1.0)
Monocytes Relative: 10 %
Neutro Abs: 3.4 10*3/uL (ref 1.7–7.7)
Neutrophils Relative %: 64 %
Platelets: 244 10*3/uL (ref 150–400)
RBC: 3.76 MIL/uL — ABNORMAL LOW (ref 3.87–5.11)
RDW: 15.7 % — ABNORMAL HIGH (ref 11.5–15.5)
WBC: 5.4 10*3/uL (ref 4.0–10.5)
nRBC: 0 % (ref 0.0–0.2)

## 2022-04-24 LAB — BASIC METABOLIC PANEL
Anion gap: 9 (ref 5–15)
BUN: 19 mg/dL (ref 8–23)
CO2: 26 mmol/L (ref 22–32)
Calcium: 8.7 mg/dL — ABNORMAL LOW (ref 8.9–10.3)
Chloride: 104 mmol/L (ref 98–111)
Creatinine, Ser: 4.94 mg/dL — ABNORMAL HIGH (ref 0.44–1.00)
GFR, Estimated: 9 mL/min — ABNORMAL LOW (ref >60)
Glucose, Bld: 96 mg/dL (ref 70–99)
Potassium: 3.8 mmol/L (ref 3.5–5.1)
Sodium: 139 mmol/L (ref 135–145)

## 2022-04-24 LAB — TYPE AND SCREEN
ABO/RH(D): O NEG
Antibody Screen: NEGATIVE

## 2022-04-24 NOTE — Telephone Encounter (Signed)
A message was forwarded from [facility] regarding the patient stating she has dialysis on Thursday and her surgery is scheduled on Thursday. As I explained to the patient before she was scheduled for surgery, that Thursday is Dr. Lucky Cowboy surgery day and it cannot be changed so she would need to change her dialysis day for that day. A message was left for a return call.

## 2022-04-25 ENCOUNTER — Encounter: Payer: Self-pay | Admitting: Vascular Surgery

## 2022-04-25 ENCOUNTER — Encounter
Admission: RE | Disposition: A | Discharge status: Home / Self Care | Payer: Self-pay | Source: Home / Self Care | Attending: Vascular Surgery

## 2022-04-25 ENCOUNTER — Other Ambulatory Visit: Payer: Self-pay

## 2022-04-25 ENCOUNTER — Ambulatory Visit
Admission: RE | Admit: 2022-04-25 | Discharge: 2022-04-25 | Disposition: A | Discharge status: Home / Self Care | Payer: Medicare PPO | Attending: Vascular Surgery | Admitting: Vascular Surgery

## 2022-04-25 ENCOUNTER — Ambulatory Visit: Payer: Medicare PPO

## 2022-04-25 ENCOUNTER — Ambulatory Visit: Payer: Medicare PPO | Admitting: Anesthesiology

## 2022-04-25 DIAGNOSIS — E1122 Type 2 diabetes mellitus with diabetic chronic kidney disease: Secondary | ICD-10-CM | POA: Diagnosis not present

## 2022-04-25 DIAGNOSIS — Z992 Dependence on renal dialysis: Secondary | ICD-10-CM | POA: Diagnosis not present

## 2022-04-25 DIAGNOSIS — T82898A Other specified complication of vascular prosthetic devices, implants and grafts, initial encounter: Secondary | ICD-10-CM | POA: Diagnosis not present

## 2022-04-25 DIAGNOSIS — N2581 Secondary hyperparathyroidism of renal origin: Secondary | ICD-10-CM | POA: Diagnosis not present

## 2022-04-25 DIAGNOSIS — N186 End stage renal disease: Secondary | ICD-10-CM

## 2022-04-25 DIAGNOSIS — I120 Hypertensive chronic kidney disease with stage 5 chronic kidney disease or end stage renal disease: Secondary | ICD-10-CM | POA: Diagnosis not present

## 2022-04-25 DIAGNOSIS — Z87891 Personal history of nicotine dependence: Secondary | ICD-10-CM | POA: Diagnosis not present

## 2022-04-25 DIAGNOSIS — Z01818 Encounter for other preprocedural examination: Secondary | ICD-10-CM

## 2022-04-25 HISTORY — PX: AV FISTULA PLACEMENT: SHX1204

## 2022-04-25 LAB — GLUCOSE, CAPILLARY: Glucose-Capillary: 103 mg/dL — ABNORMAL HIGH (ref 70–99)

## 2022-04-25 SURGERY — INSERTION OF ARTERIOVENOUS (AV) GORE-TEX GRAFT ARM
Anesthesia: Regional | Laterality: Left

## 2022-04-25 MED ORDER — FENTANYL CITRATE PF 50 MCG/ML IJ SOSY
PREFILLED_SYRINGE | INTRAMUSCULAR | Status: AC
  Administered 2022-04-25: 25 ug via INTRAVENOUS
  Filled 2022-04-25: qty 1

## 2022-04-25 MED ORDER — HEPARIN SODIUM (PORCINE) 5000 UNIT/ML IJ SOLN
INTRAMUSCULAR | Status: AC
  Filled 2022-04-25: qty 1

## 2022-04-25 MED ORDER — EPINEPHRINE PF 1 MG/ML IJ SOLN
INTRAMUSCULAR | Status: AC
  Filled 2022-04-25: qty 1

## 2022-04-25 MED ORDER — ONDANSETRON HCL 4 MG/2ML IJ SOLN: 4.0000 mg | Freq: Once | INTRAMUSCULAR | Status: DC | PRN

## 2022-04-25 MED ORDER — HEPARIN 5000 UNITS IN NS 1000 ML (FLUSH)
INTRAMUSCULAR | Status: DC | PRN
  Administered 2022-04-25: 1 mL via INTRAMUSCULAR

## 2022-04-25 MED ORDER — CHLORHEXIDINE GLUCONATE 0.12 % MT SOLN: 15.0000 mL | Freq: Once | OROMUCOSAL | Status: AC

## 2022-04-25 MED ORDER — CEFAZOLIN SODIUM-DEXTROSE 2-4 GM/100ML-% IV SOLN
2.0000 g | INTRAVENOUS | Status: AC
  Administered 2022-04-25: 2 g via INTRAVENOUS

## 2022-04-25 MED ORDER — FENTANYL CITRATE PF 50 MCG/ML IJ SOSY: 25.0000 ug | PREFILLED_SYRINGE | INTRAMUSCULAR | Status: DC | PRN

## 2022-04-25 MED ORDER — MIDAZOLAM HCL 2 MG/2ML IJ SOLN
1.0000 mg | INTRAMUSCULAR | Status: AC | PRN
  Administered 2022-04-25: 1 mg via INTRAVENOUS

## 2022-04-25 MED ORDER — CHLORHEXIDINE GLUCONATE CLOTH 2 % EX PADS
6.0000 | MEDICATED_PAD | Freq: Once | CUTANEOUS | Status: AC
  Administered 2022-04-25: 6 via TOPICAL

## 2022-04-25 MED ORDER — FENTANYL CITRATE PF 50 MCG/ML IJ SOSY: 50.0000 ug | PREFILLED_SYRINGE | INTRAMUSCULAR | Status: DC | PRN

## 2022-04-25 MED ORDER — HEMOSTATIC AGENTS (NO CHARGE) OPTIME
TOPICAL | Status: DC | PRN
  Administered 2022-04-25: 1 via TOPICAL

## 2022-04-25 MED ORDER — SODIUM CHLORIDE 0.9 % IV SOLN: INTRAVENOUS | Status: DC

## 2022-04-25 MED ORDER — PHENYLEPHRINE HCL-NACL 20-0.9 MG/250ML-% IV SOLN
INTRAVENOUS | Status: DC | PRN
  Administered 2022-04-25: 30 ug/min via INTRAVENOUS

## 2022-04-25 MED ORDER — ONDANSETRON HCL 4 MG/2ML IJ SOLN
INTRAMUSCULAR | Status: DC | PRN
  Administered 2022-04-25: 4 mg via INTRAVENOUS

## 2022-04-25 MED ORDER — ACETAMINOPHEN 160 MG/5ML PO SOLN: 325.0000 mg | ORAL | Status: DC | PRN

## 2022-04-25 MED ORDER — MIDAZOLAM HCL 2 MG/2ML IJ SOLN
INTRAMUSCULAR | Status: AC
  Administered 2022-04-25: 1 mg via INTRAVENOUS
  Filled 2022-04-25: qty 2

## 2022-04-25 MED ORDER — CEFAZOLIN SODIUM-DEXTROSE 2-4 GM/100ML-% IV SOLN
INTRAVENOUS | Status: AC
  Filled 2022-04-25: qty 100

## 2022-04-25 MED ORDER — ORAL CARE MOUTH RINSE: 15.0000 mL | Freq: Once | OROMUCOSAL | Status: AC

## 2022-04-25 MED ORDER — BUPIVACAINE-EPINEPHRINE (PF) 0.5% -1:200000 IJ SOLN
INTRAMUSCULAR | Status: AC
  Filled 2022-04-25: qty 30

## 2022-04-25 MED ORDER — CHLORHEXIDINE GLUCONATE 0.12 % MT SOLN
OROMUCOSAL | Status: AC
  Administered 2022-04-25: 15 mL via OROMUCOSAL
  Filled 2022-04-25: qty 15

## 2022-04-25 MED ORDER — PROPOFOL 10 MG/ML IV BOLUS
INTRAVENOUS | Status: DC | PRN
  Administered 2022-04-25: 20 mg via INTRAVENOUS

## 2022-04-25 MED ORDER — HEPARIN SODIUM (PORCINE) 1000 UNIT/ML IJ SOLN
INTRAMUSCULAR | Status: AC
  Filled 2022-04-25: qty 20

## 2022-04-25 MED ORDER — FENTANYL CITRATE (PF) 100 MCG/2ML IJ SOLN
INTRAMUSCULAR | Status: AC
  Filled 2022-04-25: qty 2

## 2022-04-25 MED ORDER — ROPIVACAINE HCL 5 MG/ML IJ SOLN
INTRAMUSCULAR | Status: AC
  Filled 2022-04-25: qty 30

## 2022-04-25 MED ORDER — PROPOFOL 1000 MG/100ML IV EMUL
INTRAVENOUS | Status: AC
  Filled 2022-04-25: qty 100

## 2022-04-25 MED ORDER — HYDROCODONE-ACETAMINOPHEN 5-325 MG PO TABS: 2.0000 | ORAL_TABLET | Freq: Four times a day (QID) | ORAL | 0 refills | Status: AC | PRN

## 2022-04-25 MED ORDER — HEPARIN SODIUM (PORCINE) 1000 UNIT/ML IJ SOLN
INTRAMUSCULAR | Status: DC | PRN
  Administered 2022-04-25: 3000 [IU] via INTRAVENOUS

## 2022-04-25 MED ORDER — ROPIVACAINE HCL 5 MG/ML IJ SOLN
INTRAMUSCULAR | Status: DC | PRN
  Administered 2022-04-25: 25 mL via PERINEURAL

## 2022-04-25 MED ORDER — ONDANSETRON HCL 4 MG/2ML IJ SOLN: 4.0000 mg | Freq: Four times a day (QID) | INTRAMUSCULAR | Status: DC | PRN

## 2022-04-25 MED ORDER — PROPOFOL 500 MG/50ML IV EMUL
INTRAVENOUS | Status: DC | PRN
  Administered 2022-04-25: 50 ug/kg/min via INTRAVENOUS

## 2022-04-25 MED ORDER — HYDROMORPHONE HCL 1 MG/ML IJ SOLN: 1.0000 mg | Freq: Once | INTRAMUSCULAR | Status: DC | PRN

## 2022-04-25 MED ORDER — EPHEDRINE SULFATE (PRESSORS) 50 MG/ML IJ SOLN
INTRAMUSCULAR | Status: DC | PRN
  Administered 2022-04-25: 5 mg via INTRAVENOUS

## 2022-04-25 MED ORDER — GLYCOPYRROLATE 0.2 MG/ML IJ SOLN
INTRAMUSCULAR | Status: DC | PRN
  Administered 2022-04-25: .2 mg via INTRAVENOUS

## 2022-04-25 MED ORDER — ACETAMINOPHEN 325 MG PO TABS: 325.0000 mg | ORAL_TABLET | ORAL | Status: DC | PRN

## 2022-04-25 MED ORDER — 0.9 % SODIUM CHLORIDE (POUR BTL) OPTIME
TOPICAL | Status: DC | PRN
  Administered 2022-04-25: 500 mL

## 2022-04-25 SURGICAL SUPPLY — 54 items
BAG DECANTER FOR FLEXI CONT (MISCELLANEOUS) ×1 IMPLANT
BLADE SURG SZ11 CARB STEEL (BLADE) ×1 IMPLANT
BOOT SUTURE AID YELLOW STND (SUTURE) ×1 IMPLANT
BRUSH SCRUB EZ  4% CHG (MISCELLANEOUS) ×1
BRUSH SCRUB EZ 4% CHG (MISCELLANEOUS) ×1 IMPLANT
CHLORAPREP W/TINT 26 (MISCELLANEOUS) ×1 IMPLANT
CLIP SPRNG 6 S-JAW DBL (CLIP) ×1 IMPLANT
CLIP SPRNG 6MM S-JAW DBL (CLIP) ×1
DERMABOND ADVANCED .7 DNX12 (GAUZE/BANDAGES/DRESSINGS) ×1 IMPLANT
ELECT CAUTERY BLADE 6.4 (BLADE) ×1 IMPLANT
ELECT REM PT RETURN 9FT ADLT (ELECTROSURGICAL) ×1
ELECTRODE REM PT RTRN 9FT ADLT (ELECTROSURGICAL) ×1 IMPLANT
GLOVE BIO SURGEON STRL SZ7 (GLOVE) ×1 IMPLANT
GOWN STRL REUS W/ TWL LRG LVL3 (GOWN DISPOSABLE) ×1 IMPLANT
GOWN STRL REUS W/ TWL XL LVL3 (GOWN DISPOSABLE) ×1 IMPLANT
GOWN STRL REUS W/TWL LRG LVL3 (GOWN DISPOSABLE) ×1
GOWN STRL REUS W/TWL XL LVL3 (GOWN DISPOSABLE) ×1
GRAFT COLLAGEN VASCULAR 8X45 (Miscellaneous) IMPLANT
HEMOSTAT SURGICEL 2X3 (HEMOSTASIS) ×1 IMPLANT
IV NS 500ML (IV SOLUTION) ×1
IV NS 500ML BAXH (IV SOLUTION) ×1 IMPLANT
KIT TURNOVER KIT A (KITS) ×1 IMPLANT
LABEL OR SOLS (LABEL) ×1 IMPLANT
LOOP RED MAXI  1X406MM (MISCELLANEOUS) ×1
LOOP VESSEL MAXI 1X406 RED (MISCELLANEOUS) ×1 IMPLANT
LOOP VESSEL MINI 0.8X406 BLUE (MISCELLANEOUS) ×1 IMPLANT
LOOPS BLUE MINI 0.8X406MM (MISCELLANEOUS) ×1
MANIFOLD NEPTUNE II (INSTRUMENTS) ×1 IMPLANT
NDL FILTER BLUNT 18X1 1/2 (NEEDLE) ×1 IMPLANT
NEEDLE FILTER BLUNT 18X1 1/2 (NEEDLE) ×1 IMPLANT
NS IRRIG 500ML POUR BTL (IV SOLUTION) ×1 IMPLANT
PACK EXTREMITY ARMC (MISCELLANEOUS) ×1 IMPLANT
PAD PREP 24X41 OB/GYN DISP (PERSONAL CARE ITEMS) ×1 IMPLANT
SLING ARM M TX990204 (SOFTGOODS) IMPLANT
SOLUTION CELL SAVER (CLIP) ×1 IMPLANT
SPIKE FLUID TRANSFER (MISCELLANEOUS) ×1 IMPLANT
STOCKINETTE 48X4 2 PLY STRL (GAUZE/BANDAGES/DRESSINGS) ×1 IMPLANT
STOCKINETTE STRL 4IN 9604848 (GAUZE/BANDAGES/DRESSINGS) ×1 IMPLANT
SUT GORETEX 6.0 TT9 (SUTURE) ×1 IMPLANT
SUT MNCRL AB 4-0 PS2 18 (SUTURE) ×1 IMPLANT
SUT PROLENE 6 0 BV (SUTURE) ×4 IMPLANT
SUT SILK 0 SH 30 (SUTURE) ×1 IMPLANT
SUT SILK 2 0 (SUTURE) ×1
SUT SILK 2-0 18XBRD TIE 12 (SUTURE) ×1 IMPLANT
SUT SILK 3 0 (SUTURE) ×1
SUT SILK 3-0 18XBRD TIE 12 (SUTURE) ×1 IMPLANT
SUT SILK 4 0 (SUTURE) ×1
SUT SILK 4-0 18XBRD TIE 12 (SUTURE) ×1 IMPLANT
SUT VIC AB 3-0 SH 27 (SUTURE) ×1
SUT VIC AB 3-0 SH 27X BRD (SUTURE) ×1 IMPLANT
SYR 20ML LL LF (SYRINGE) ×1 IMPLANT
SYR 3ML LL SCALE MARK (SYRINGE) ×1 IMPLANT
TRAP FLUID SMOKE EVACUATOR (MISCELLANEOUS) ×1 IMPLANT
WATER STERILE IRR 500ML POUR (IV SOLUTION) ×1 IMPLANT

## 2022-04-25 NOTE — Op Note (Signed)
[facility]  OPERATIVE NOTE   PROCEDURE:  Left upper arm brachial artery to axillary vein arteriovenous graft with 7 mm Artegraft  PRE-OPERATIVE DIAGNOSIS: 1. end stage renal disease  2.  Failed left brachiocephalic AV fistula  POST-OPERATIVE DIAGNOSIS: same  SURGEON: Leotis Pain  ASSISTANT(S): None  ANESTHESIA: general  ESTIMATED BLOOD LOSS: 25 cc  FINDING(S): None  SPECIMEN(S):  None  INDICATIONS:   Shelby Galloway is a 76 y.o. female who presents with end stage renal disease and a failed left brachiocephalic AV fistula that had a stump of the fistula is patent before including.  Risk, benefits, and alternatives to access surgery were discussed.  The patient is aware the risks include but are not limited to: bleeding, infection, steal syndrome, nerve damage, ischemic monomelic neuropathy, failure to mature, and need for additional procedures.  The patient is aware of the risks and elects to proceed forward.  DESCRIPTION: After full informed written consent was obtained from the patient, the patient was brought back to the operating room and placed supine upon the operating table.  The patient was given IV antibiotics prior to proceeding.  After obtaining adequate sedation, the patient was prepped and draped in standard fashion for a left arm access procedure.  I turned my attention first to the antecubitum.   I made an incision over brachial artery to cephalic vein anastomosis from the previous AV fistula.  I dissected out the initial portion of the AV fistula and prepared it for clamping that would allow [place] not to clamp her actual brachial artery.  I made an incision and dissected down through the subcutaneous tissue and fascia until I reached the axillary vein.  It was patent and adequate size for graft creation.  I then dissected this vein proximally and distal and prepared it for control with Bulldog clamps.  I took a Dietitian and dissected  from the antecubital up to the axillary incision.  Then I delivered the 7 mm Artegraft, through this metal tunneler and then pulled out the metal tunneler leaving the graft in place and making sure the line was up for orientation.  I then gave the patient 3000 units of heparin to gain some anticoagulation.  After waiting 3 minutes, I clamped the initial portion of the left brachiocephalic AV fistula on the cephalic vein allowing [place] not to clamp the artery.  I then transected this initial portion and oversewed it distally with a 0 silk suture ligature.  There was some mural thrombus that was removed and sent for specimen.  I then prepared the anastomosis in an end-to-end fashion using a pair of 6-0 Prolene sutures and sewing the anastomosis and quarters using each of the 4 stitches.  At this point, then I completed the anastomosis in the usual fashion.  I released the vascular clamp on the inflow and allowed the artery to decompress through the graft. There was good pulsatile bleeding through this graft.  I clamped the graft near its arterial anastomosis and sucked out all the blood in the graft and loaded the graft with heparinized saline.  At this point, I pulled the graft to appropriate length and reset my exposure of the axillary vein.  Then, I controlled the vein with Bulldog clamps.  An anterior wall venotomy was created with an 11 blade and extended with Potts scissors.  I then spatulated the graft to facilitate an end-to- side anastomosis matching the arteriotomy.  In the process of spatulating, I cut  the graft to appropriate length for this anastomosis.  This graft was sewn to the vein in an end-to-side configuration with a running 6-0 Prolene.  Prior to completing this anastomosis, I allowed the vein to back bleed and then I also allowed the artery to bleed in an antegrade fashion.  I completed this anastomosis in the usual fashion, and then irrigated out the high bicipital exposure and then placed  Surgicel.  I then turned my attention back to the antecubitum.  There was pulsatile flow in the artery beyond the graft.   At this point, I washed out the antecubital incision. Surgicel and Evicel were then placed. There was no more active bleeding.  The subcutaneous tissue was reapproximated with a running stitch of 3-0 Vicryl.  The skin was then reapproximated with a running subcuticular 4-0 Monocryl.  The skin was then cleaned, dried, and Dermabond used to reinforce the skin closure.  We then turned our attention to the axillary incision.  The subcutaneous tissue was repaired with running stitch of 3-0 Vicryl.  The skin was then reapproximated with running subcuticular 4-0 Monocryl.  The skin was then cleaned, dried, and then the skin closure was reinforced with Dermabond.  The patient was then awakened from anesthesia and taken to the recovery room in stable condition having tolerated the procedure well.    COMPLICATIONS: None  CONDITION: Stable   Leotis Pain 04/25/2022 5:22 PM   This note was created with Dragon Medical transcription system. Any errors in dictation are purely unintentional.

## 2022-04-25 NOTE — Anesthesia Procedure Notes (Signed)
Anesthesia Regional Block: Supraclavicular block   Pre-Anesthetic Checklist: , timeout performed,  Correct Patient, Correct Site, Correct Laterality,  Correct Procedure, Correct Position, site marked,  Risks and benefits discussed,  Surgical consent,  Pre-op evaluation,  At surgeon's request and post-op pain management  Laterality: Upper and Left  Prep: chloraprep       Needles:  Injection technique: Single-shot  Needle Type: Echogenic Stimulator Needle     Needle Length: 10cm  Needle Gauge: 21   Needle insertion depth: 6 cm   Additional Needles:   Procedures: Doppler guided,,,, ultrasound used (permanent image in chart),,   Motor weakness within 6 minutes.  Narrative:  Start time: 04/25/2022 2:05 PM End time: 04/25/2022 2:10 PM Injection made incrementally with aspirations every 5 mL.  Performed by: Personally  Anesthesiologist: Alphonsus Sias, MD  Additional Notes: Functioning IV was confirmed and O2 [place]/monitors were applied. Light sedation administered as required, patient responsive throughout. A 155mm 21ga EchoStim needle was used. Sterile prep and drape,hand hygiene and sterile gloves were used.  Negative aspiration and negative test dose prior to incremental administration of local anesthetic. 0.5% ropivicaine for skin wheal, 4 ml. Total LA: 70ml - 0.5% Ropivicaine 30ml/1:200 epi. U/S images stored in chart. The patient tolerated the procedure well.

## 2022-04-25 NOTE — H&P (Signed)
[facility] Admission History & Physical  MRN : [id number]  Shelby Galloway is a 76 y.o. ([birth year]) female who presents with chief complaint of No chief complaint on file. Marland Kitchen  History of Present Illness: Patient presents today for her dialysis access placement.  He has had previous vein mapping which shows adequate veins for a left brachial artery to axillary vein AV graft.  No new complaints.  Current Facility-Administered Medications  Medication Dose Route Frequency Provider Last Rate Last Admin   0.9 %  sodium chloride infusion   Intravenous Continuous Martha Clan, MD       ceFAZolin (ANCEF) 2-4 GM/100ML-% IVPB            [START ON 04/26/2022] ceFAZolin (ANCEF) IVPB 2g/100 mL premix  2 g Intravenous On Call to OR Kris Hartmann, NP       EPINEPHrine (ADRENALIN) 1 MG/ML            fentaNYL (SUBLIMAZE) injection 25 mcg  25 mcg Intravenous Q5 Min x 2 PRN Alphonsus Sias, MD   25 mcg at 04/25/22 1355   HYDROmorphone (DILAUDID) injection 1 mg  1 mg Intravenous Once PRN Kris Hartmann, NP       ondansetron [facility]) injection 4 mg  4 mg Intravenous Q6H PRN Kris Hartmann, NP       Facility-Administered Medications Ordered in Other Encounters  Medication Dose Route Frequency Provider Last Rate Last Admin   ropivacaine (PF) 5 mg/mL (0.5%) (NAROPIN) injection   Peri-NEURAL Anesthesia Intra-op Alphonsus Sias, MD   25 mL at 04/25/22 1408    Past Medical History:  Diagnosis Date   Anemia    Arthritis    Diabetes mellitus without complication ([place])    lost weight and diet controlled-no meds   Dialysis patient ([place])    ESRD (end stage renal disease) ([place])    Gout    Hypertension    Renal disorder    stage 5 ckd   Secondary hyperparathyroidism of renal origin ([place])    Vitamin B12 deficiency     Past Surgical History:  Procedure Laterality Date   A/V FISTULAGRAM Left 04/24/2020   Procedure: A/V FISTULAGRAM;  Surgeon: Algernon Huxley, MD;  Location: [place] [facility];  Service: Cardiovascular;  Laterality: Left;   A/V FISTULAGRAM Left 12/25/2020   Procedure: A/V FISTULAGRAM;  Surgeon: Algernon Huxley, MD;  Location: [place] [facility];  Service: Cardiovascular;  Laterality: Left;   ABDOMINAL HYSTERECTOMY     AV FISTULA PLACEMENT Left 08/04/2019   Procedure: INSERTION OF ARTERIOVENOUS (AV) GORE-TEX GRAFT ARM;  Surgeon: Algernon Huxley, MD;  Location: [facility];  Service: Vascular;  Laterality: Left;   UPPER EXTREMITY VENOGRAPHY Right 02/18/2022   Procedure: UPPER EXTREMITY VENOGRAPHY;  Surgeon: Algernon Huxley, MD;  Location: [place] [facility];  Service: Cardiovascular;  Laterality: Right;     Social History   Tobacco Use   Smoking status: Former    Packs/day: 1.00    Years: 15.00    Total pack years: 15.00    Types: Cigarettes    Quit date: 2010    Years since quitting: 13.7   Smokeless tobacco: Never  Vaping Use   Vaping Use: Never used  Substance Use Topics   Alcohol use: No   Drug use: Never     Family History  Problem Relation Age of Onset   Breast cancer Neg Hx     Allergies  Allergen Reactions  Losartan     States worsened gout   Ace Inhibitors     Sinus issues   Atorvastatin     Sinus side effects   Chlorthalidone     Sinus issues   Codeine Other (See Comments)    States she feels "funny " in the head     REVIEW OF SYSTEMS (Negative unless checked)  Constitutional: [] Weight loss  [] Fever  [] Chills Cardiac: [] Chest pain   [] Chest pressure   [] Palpitations   [] Shortness of breath when laying flat   [] Shortness of breath at rest   [x] Shortness of breath with exertion. Vascular:  [] Pain in legs with walking   [] Pain in legs at rest   [] Pain in legs when laying flat   [] Claudication   [] Pain in feet when walking  [] Pain in feet at rest  [] Pain in feet when laying flat   [] History of DVT   [] Phlebitis   [] Swelling in legs   [] Varicose veins   [] Non-healing ulcers Pulmonary:   [] Uses home oxygen   [] Productive cough    [] Hemoptysis   [] Wheeze  [] COPD   [] Asthma Neurologic:  [] Dizziness  [] Blackouts   [] Seizures   [] History of stroke   [] History of TIA  [] Aphasia   [] Temporary blindness   [] Dysphagia   [] Weakness or numbness in arms   [] Weakness or numbness in legs Musculoskeletal:  [x] Arthritis   [] Joint swelling   [x] Joint pain   [] Low back pain Hematologic:  [] Easy bruising  [] Easy bleeding   [] Hypercoagulable state   [x] Anemic  [] Hepatitis Gastrointestinal:  [] Blood in stool   [] Vomiting blood  [] Gastroesophageal reflux/heartburn   [] Difficulty swallowing. Genitourinary:  [x] Chronic kidney disease   [] Difficult urination  [] Frequent urination  [] Burning with urination   [] Blood in urine Skin:  [] Rashes   [] Ulcers   [] Wounds Psychological:  [] History of anxiety   []  History of major depression.  Physical Examination  Vitals:   04/25/22 1355 04/25/22 1400 04/25/22 1405 04/25/22 1410  BP: 122/65 115/61 105/64 (!) 101/59  Pulse: (!) 55 (!) 58 63 (!) 57  Resp: 17 16 16 15   Temp:      TempSrc:      SpO2: 100% 98% 97% 98%  Weight:      Height:       Body mass index is 22.6 kg/m. Gen: WD/WN, NAD Head: [place]/AT, No temporalis wasting.  Ear/Nose/Throat: Hearing grossly intact, nares w/o erythema or drainage, oropharynx w/o Erythema/Exudate,  Eyes: Conjunctiva clear, sclera non-icteric Neck: Trachea midline.  No JVD.  Pulmonary:  Good air movement, respirations not labored, no use of accessory muscles.  Cardiac: RRR, no JVD Vascular:  Vessel Right Left  Radial Palpable Palpable           Musculoskeletal: M/S 5/5 throughout.  Extremities without ischemic changes.  No deformity or atrophy.  Neurologic: Sensation grossly intact in extremities.  Symmetrical.  Speech is fluent. Motor exam as listed above. Psychiatric: Judgment intact, Mood & affect appropriate for pt's clinical situation. Dermatologic: No rashes or ulcers noted.  No cellulitis or open wounds.      CBC Lab Results  Component Value  Date   WBC 5.4 04/24/2022   HGB 10.7 (L) 04/24/2022   HCT 33.9 (L) 04/24/2022   MCV 90.2 04/24/2022   PLT 244 04/24/2022    BMET    Component Value Date/Time   NA 139 04/24/2022 0933   K 3.8 04/24/2022 0933   CL 104 04/24/2022 0933   CO2 26 04/24/2022 0933  GLUCOSE 96 04/24/2022 0933   BUN 19 04/24/2022 0933   CREATININE 4.94 (H) 04/24/2022 0933   CALCIUM 8.7 (L) 04/24/2022 0933   GFRNONAA 9 (L) 04/24/2022 0933   GFRAA 10 (L) 08/02/2019 1258   Estimated Creatinine Clearance: 9.1 mL/min (A) (by C-G formula based on SCr of 4.94 mg/dL (H)).  COAG Lab Results  Component Value Date   INR 1.1 08/02/2019    Radiology [place] OR NERVE BLOCK-IMAGE ONLY [facility])  Result Date: 04/25/2022 There is no interpretation for this exam.  This order is for images obtained during a surgical procedure.  Please See "Surgeries" Tab for more information regarding the procedure.     Assessment/Plan 1. ESRD.  For left arm AVG today. Risks and benefits discussed 2.  Diabetes.  Stable on outpatient medications and likely an underlying cause of her renal failure and blood glucose control important in reducing the progression of atherosclerotic disease. Also, involved in wound healing. On appropriate medications. 3. Hypertension.  An underlying cause of her renal failure and blood pressure control important in reducing the progression of atherosclerotic disease. On appropriate oral medications.    Leotis Pain, MD  04/25/2022 3:02 PM

## 2022-04-25 NOTE — Anesthesia Procedure Notes (Signed)
Date/Time: 04/25/2022 3:54 PM  Performed by: Lily Peer, Ralston Venus, CRNAPre-anesthesia Checklist: Patient identified, Emergency Drugs available, Suction available, Patient being monitored and Timeout performed Patient Re-evaluated:Patient Re-evaluated prior to induction Oxygen Delivery Method: Simple face mask Induction Type: IV induction

## 2022-04-25 NOTE — Transfer of Care (Signed)
Immediate Anesthesia Transfer of Care Note  Patient: Shelby Galloway [facility]  Procedure(s) Performed: INSERTION OF ARTERIOVENOUS (AV) ARTEGRAFT ARM ( BRACHIAL AXILLARY) (Left)  Patient Location: PACU  Anesthesia Type:General  Level of Consciousness: awake, alert  and oriented  Airway & Oxygen Therapy: Patient Spontanous Breathing and Patient connected to face mask oxygen  Post-op Assessment: Report given to RN and Post -op Vital signs reviewed and stable  Post vital signs: Reviewed and stable  Last Vitals:  Vitals Value Taken Time  BP 107/61 04/25/22 1723  Temp    Pulse 75 04/25/22 1724  Resp 18 04/25/22 1724  SpO2 100 % 04/25/22 1724  Vitals shown include unvalidated device data.  Last Pain:  Vitals:   04/25/22 1245  TempSrc: Oral  PainSc: 7          Complications: No notable events documented.

## 2022-04-25 NOTE — Discharge Instructions (Signed)

## 2022-04-25 NOTE — Anesthesia Preprocedure Evaluation (Addendum)
Anesthesia Evaluation  Patient identified by MRN, date of birth, ID band Patient awake    Reviewed: Allergy & Precautions, NPO status , Patient's Chart, lab work & pertinent test results  Airway Mallampati: III  TM Distance: >3 FB Neck ROM: full    Dental  (+) Chipped, Missing, Poor Dentition, Edentulous Upper, Partial Lower   Pulmonary former smoker,    Pulmonary exam normal        Cardiovascular hypertension, Normal cardiovascular exam     Neuro/Psych negative neurological ROS  negative psych ROS   GI/Hepatic negative GI ROS, Neg liver ROS, neg GERD  ,  Endo/Other  diabetes  Renal/GU DialysisRenal diseaseT Th S schedule, last done this past tues. K=3.8   negative genitourinary   Musculoskeletal  (+) Arthritis ,   Abdominal   Peds  Hematology  (+) Blood dyscrasia, anemia ,   Anesthesia Other Findings Past Medical History: No date: Anemia No date: Arthritis No date: Diabetes mellitus without complication (HCC)     Comment:  lost weight and diet controlled-no meds No date: Dialysis patient ([place]) No date: ESRD (end stage renal disease) ([place]) No date: Gout No date: Hypertension No date: Renal disorder     Comment:  stage 5 ckd No date: Secondary hyperparathyroidism of renal origin ([place]) No date: Vitamin B12 deficiency  Past Surgical History: 04/24/2020: A/V FISTULAGRAM; Left     Comment:  Procedure: A/V FISTULAGRAM;  Surgeon: Algernon Huxley, MD;               Location: [place] [facility];  Service: Cardiovascular;              Laterality: Left; 12/25/2020: A/V FISTULAGRAM; Left     Comment:  Procedure: A/V FISTULAGRAM;  Surgeon: Algernon Huxley, MD;               Location: [place] [facility];  Service: Cardiovascular;              Laterality: Left; No date: ABDOMINAL HYSTERECTOMY 08/04/2019: AV FISTULA PLACEMENT; Left     Comment:  Procedure: INSERTION OF ARTERIOVENOUS (AV) GORE-TEX               GRAFT ARM;   Surgeon: Algernon Huxley, MD;  Location: [facility]               ORS;  Service: Vascular;  Laterality: Left; 02/18/2022: UPPER EXTREMITY VENOGRAPHY; Right     Comment:  Procedure: UPPER EXTREMITY VENOGRAPHY;  Surgeon: Algernon Huxley, MD;  Location: [place] [facility];  Service:               Cardiovascular;  Laterality: Right;  BMI    Body Mass Index: 22.60 kg/m      Reproductive/Obstetrics negative OB ROS                            Anesthesia Physical Anesthesia Plan  ASA: 3  Anesthesia Plan: Regional   Post-op Pain Management:    Induction: Intravenous  PONV Risk Score and Plan: Propofol infusion and TIVA  Airway Management Planned: Natural Airway and Nasal Cannula  Additional Equipment:   Intra-op Plan:   Post-operative Plan:   Informed Consent: I have reviewed the patients History and Physical, chart, labs and discussed the procedure including the risks, benefits and alternatives for the proposed anesthesia with the patient or  authorized representative who has indicated his/her understanding and acceptance.     Dental Advisory Given  Plan Discussed with: Anesthesiologist, CRNA and Surgeon  Anesthesia Plan Comments: (Discussed Supraclavicluar block, pt accepts  Patient consented for risks of anesthesia including but not limited to:  - adverse reactions to medications - risk of airway placement if required - damage to eyes, teeth, lips or other oral mucosa - nerve damage due to positioning  - sore throat or hoarseness - Damage to heart, brain, nerves, lungs, other parts of body or loss of life  Patient voiced understanding.)        Anesthesia Quick Evaluation

## 2022-04-26 ENCOUNTER — Encounter: Payer: Self-pay | Admitting: Vascular Surgery

## 2022-04-29 LAB — SURGICAL PATHOLOGY

## 2022-04-30 NOTE — Anesthesia Postprocedure Evaluation (Signed)
Anesthesia Post Note  Patient: Shelby Galloway [facility]  Procedure(s) Performed: INSERTION OF ARTERIOVENOUS (AV) ARTEGRAFT ARM ( BRACHIAL AXILLARY) (Left)  Patient location during evaluation: PACU Anesthesia Type: Regional Level of consciousness: awake and alert Pain management: pain level controlled Vital Signs Assessment: post-procedure vital signs reviewed and stable Respiratory status: spontaneous breathing, nonlabored ventilation, respiratory function stable and patient connected to nasal cannula oxygen Cardiovascular status: blood pressure returned to baseline and stable Postop Assessment: no apparent nausea or vomiting Anesthetic complications: no   No notable events documented.   Last Vitals:  Vitals:   04/25/22 1730 04/25/22 1745  BP: 115/61 109/62  Pulse: 74 77  Resp: 19 18  Temp:    SpO2: 99% 98%    Last Pain:  Vitals:   04/25/22 1745  TempSrc:   PainSc: 0-No pain                 Martha Clan

## 2022-10-24 ENCOUNTER — Other Ambulatory Visit: Payer: Self-pay | Admitting: Student

## 2022-10-24 DIAGNOSIS — Z780 Asymptomatic menopausal state: Secondary | ICD-10-CM

## 2023-10-24 ENCOUNTER — Emergency Department
Admission: EM | Admit: 2023-10-24 | Discharge: 2023-10-24 | Disposition: A | Discharge status: Home / Self Care | Attending: Emergency Medicine | Admitting: Emergency Medicine

## 2023-10-24 ENCOUNTER — Other Ambulatory Visit: Payer: Self-pay

## 2023-10-24 DIAGNOSIS — I120 Hypertensive chronic kidney disease with stage 5 chronic kidney disease or end stage renal disease: Secondary | ICD-10-CM | POA: Diagnosis not present

## 2023-10-24 DIAGNOSIS — E1122 Type 2 diabetes mellitus with diabetic chronic kidney disease: Secondary | ICD-10-CM | POA: Insufficient documentation

## 2023-10-24 DIAGNOSIS — N186 End stage renal disease: Secondary | ICD-10-CM | POA: Diagnosis not present

## 2023-10-24 DIAGNOSIS — R531 Weakness: Secondary | ICD-10-CM | POA: Insufficient documentation

## 2023-10-24 DIAGNOSIS — Z992 Dependence on renal dialysis: Secondary | ICD-10-CM | POA: Insufficient documentation

## 2023-10-24 LAB — CBC
HCT: 33.2 % — ABNORMAL LOW (ref 36.0–46.0)
Hemoglobin: 11.1 g/dL — ABNORMAL LOW (ref 12.0–15.0)
MCH: 28.2 pg (ref 26.0–34.0)
MCHC: 33.4 g/dL (ref 30.0–36.0)
MCV: 84.5 fL (ref 80.0–100.0)
Platelets: 236 10*3/uL (ref 150–400)
RBC: 3.93 MIL/uL (ref 3.87–5.11)
RDW: 14.8 % (ref 11.5–15.5)
WBC: 5.2 10*3/uL (ref 4.0–10.5)
nRBC: 0 % (ref 0.0–0.2)

## 2023-10-24 LAB — BASIC METABOLIC PANEL WITH GFR
Anion gap: 14 (ref 5–15)
BUN: 19 mg/dL (ref 8–23)
CO2: 23 mmol/L (ref 22–32)
Calcium: 8.6 mg/dL — ABNORMAL LOW (ref 8.9–10.3)
Chloride: 94 mmol/L — ABNORMAL LOW (ref 98–111)
Creatinine, Ser: 4.1 mg/dL — ABNORMAL HIGH (ref 0.44–1.00)
GFR, Estimated: 11 mL/min — ABNORMAL LOW (ref >60)
Glucose, Bld: 140 mg/dL — ABNORMAL HIGH (ref 70–99)
Potassium: 3.1 mmol/L — ABNORMAL LOW (ref 3.5–5.1)
Sodium: 131 mmol/L — ABNORMAL LOW (ref 135–145)

## 2023-10-24 MED ORDER — ONDANSETRON HCL 4 MG/2ML IJ SOLN
4.0000 mg | Freq: Once | INTRAMUSCULAR | Status: AC
  Administered 2023-10-24: 4 mg via INTRAVENOUS
  Filled 2023-10-24: qty 2

## 2023-10-24 MED ORDER — SODIUM CHLORIDE 0.9 % IV BOLUS
500.0000 mL | Freq: Once | INTRAVENOUS | Status: AC
  Administered 2023-10-24: 500 mL via INTRAVENOUS

## 2023-10-24 NOTE — ED Triage Notes (Signed)
 C/O dizzy and chills x 1 day.  AAOx3.  Skin warm and dry. NAD

## 2023-10-24 NOTE — ED Provider Notes (Signed)
 [facility] Provider Note    Event Date/Time   First MD Initiated Contact with Patient 10/24/23 1504     (approximate)   History   Dizziness   HPI  Shelby Galloway is a 78 year old female with history of HTN, T2DM, ESRD on HD presenting to the emergency department for evaluation of weakness.  Since yesterday, patient has felt generally weak with decreased appetite and lightheadedness.  Denies syncope.  Denies chest pain or shortness of breath.  Completed her dialysis session yesterday, is unsure if they pulled off more than usual.  No fevers.  Significantly decreased p.o. intake due to decreased appetite, but no vomiting or diarrhea.  No abdominal pain.  No headache, numbness, tingling, focal weakness.  Reports that she was told that she had very low iron levels, but has not yet received treatment for this.     Physical Exam   Triage Vital Signs: ED Triage Vitals  Encounter Vitals Group     BP 10/24/23 1103 (!) 160/71     Systolic BP Percentile --      Diastolic BP Percentile --      Pulse Rate 10/24/23 1103 87     Resp 10/24/23 1100 16     Temp 10/24/23 1100 98.8 F (37.1 C)     Temp Source 10/24/23 1100 Oral     SpO2 10/24/23 1100 100 %     Weight 10/24/23 1101 139 lb 15.9 oz (63.5 kg)     Height --      Head Circumference --      Peak Flow --      Pain Score 10/24/23 1100 0     Pain Loc --      Pain Education --      Exclude from Growth Chart --     Most recent vital signs: Vitals:   10/24/23 1500 10/24/23 1730  BP: 124/67 (!) 147/73  Pulse: 80 80  Resp: 16 16  Temp:    SpO2: 100% 100%     General: Awake, interactive  CV:  Regular rate, good peripheral perfusion.  Resp:  Unlabored respirations, lungs clear to auscultation Abd:  Nondistended.  Soft, nontender to palpation Neuro:  Alert and oriented, normal extraocular movements, symmetric facial movement, sensation intact over bilateral upper and lower extremities with 5 out of  5 strength.  Normal finger-to-nose testing.   ED Results / Procedures / Treatments   Labs (all labs ordered are listed, but only abnormal results are displayed) Labs Reviewed  BASIC METABOLIC PANEL WITH GFR - Abnormal; Notable for the following components:      Result Value   Sodium 131 (*)    Potassium 3.1 (*)    Chloride 94 (*)    Glucose, Bld 140 (*)    Creatinine, Ser 4.10 (*)    Calcium 8.6 (*)    GFR, Estimated 11 (*)    All other components within normal limits  CBC - Abnormal; Notable for the following components:   Hemoglobin 11.1 (*)    HCT 33.2 (*)    All other components within normal limits     EKG EKG independently reviewed interpreted by myself (ER attending) demonstrates:  EKG demonstrates sinus rhythm at a rate of 89, PR 144, QRS 84, QTc 450, artifact present with nonspecific ST changes  RADIOLOGY Imaging independently reviewed and interpreted by myself demonstrates:    PROCEDURES:  Critical Care performed: No  Procedures   MEDICATIONS ORDERED IN ED: Medications  ondansetron (  ZOFRAN) injection 4 mg (4 mg Intravenous Given 10/24/23 1524)  sodium chloride 0.9 % bolus 500 mL (0 mLs Intravenous Stopped 10/24/23 1739)     IMPRESSION / MDM / ASSESSMENT AND PLAN / ED COURSE  I reviewed the triage vital signs and the nursing notes.  Differential diagnosis includes, but is not limited to, anemia, electrolyte abnormality, dehydration, viral illness, low suspicion acute intracranial process given reassuring neurologic exam, absence of headache, trauma  Patient's presentation is most consistent with acute presentation with potential threat to life or bodily function.  78 year old female presenting with weakness.  Stable vitals on presentation.  Labs with stable anemia.  Elevated creatinine consistent with known history of ESRD.  Slightly low potassium, but will hold off on repletion given anticipated natural rise in an ESRD patient.  I suspect patient may be  somewhat volume depleted given her decreased p.o. intake and recent dialysis.  She was given a small fluid bolus as well as Zofran.  12:28 AM Patient reevaluated.  Able to tolerate fluids without issue.  Continues to feel generally weak, but no chest pain, shortness of breath, dizziness.  Patient reports making minimal urine and denies dysuria or changes in her urination.  With this, do think it is reasonable to hold off on urine testing for her.  Discussed discharge and patient is comfortable with this plan.  Strict return precautions provided.  Patient discharged in stable condition.     FINAL CLINICAL IMPRESSION(S) / ED DIAGNOSES   Final diagnoses:  Generalized weakness     Rx / DC Orders   ED Discharge Orders     None        Note:  This document was prepared using Dragon voice recognition software and may include unintentional dictation errors.   Trinna Post, MD 10/25/23 [phone]

## 2023-10-24 NOTE — Discharge Instructions (Signed)
 You were seen in the ER today for evaluation of your weakness.  Your testing was fortunately overall reassuring.  Please make sure you are staying hydrated and eating regular meals.  Follow-up with your primary care doctor for further evaluation.  You can also discuss if they recommend treatment for your low iron levels.  Return to the ER for new or worsening symptoms.

## 2023-10-24 NOTE — ED Triage Notes (Signed)
 Pt comes via [facility] from [employer] [facility]. Pt was getting her car worked on. Pt states dizziness and shakey when standing for the last 5 hours. Pt is diabetic. CBG 139. Pt did have breakfast today.   100* 161/75 HR 85  Pt is dialysis T, TH, Sat and did have treatment  yesterday. Pt states they took off little more this time.

## 2023-10-26 ENCOUNTER — Other Ambulatory Visit: Payer: Self-pay

## 2023-10-26 ENCOUNTER — Emergency Department

## 2023-10-26 ENCOUNTER — Emergency Department
Admission: EM | Admit: 2023-10-26 | Discharge: 2023-10-26 | Disposition: A | Discharge status: Home / Self Care | Attending: Emergency Medicine | Admitting: Emergency Medicine

## 2023-10-26 DIAGNOSIS — R531 Weakness: Secondary | ICD-10-CM | POA: Insufficient documentation

## 2023-10-26 DIAGNOSIS — R0602 Shortness of breath: Secondary | ICD-10-CM | POA: Insufficient documentation

## 2023-10-26 DIAGNOSIS — E1122 Type 2 diabetes mellitus with diabetic chronic kidney disease: Secondary | ICD-10-CM | POA: Diagnosis not present

## 2023-10-26 DIAGNOSIS — Z992 Dependence on renal dialysis: Secondary | ICD-10-CM | POA: Insufficient documentation

## 2023-10-26 DIAGNOSIS — I120 Hypertensive chronic kidney disease with stage 5 chronic kidney disease or end stage renal disease: Secondary | ICD-10-CM | POA: Insufficient documentation

## 2023-10-26 DIAGNOSIS — N186 End stage renal disease: Secondary | ICD-10-CM | POA: Insufficient documentation

## 2023-10-26 LAB — CBC
HCT: 30.6 % — ABNORMAL LOW (ref 36.0–46.0)
Hemoglobin: 10.2 g/dL — ABNORMAL LOW (ref 12.0–15.0)
MCH: 29 pg (ref 26.0–34.0)
MCHC: 33.3 g/dL (ref 30.0–36.0)
MCV: 86.9 fL (ref 80.0–100.0)
Platelets: 219 10*3/uL (ref 150–400)
RBC: 3.52 MIL/uL — ABNORMAL LOW (ref 3.87–5.11)
RDW: 15.1 % (ref 11.5–15.5)
WBC: 4.7 10*3/uL (ref 4.0–10.5)
nRBC: 0 % (ref 0.0–0.2)

## 2023-10-26 LAB — BASIC METABOLIC PANEL WITH GFR
Anion gap: 11 (ref 5–15)
BUN: 20 mg/dL (ref 8–23)
CO2: 26 mmol/L (ref 22–32)
Calcium: 8.7 mg/dL — ABNORMAL LOW (ref 8.9–10.3)
Chloride: 97 mmol/L — ABNORMAL LOW (ref 98–111)
Creatinine, Ser: 4.26 mg/dL — ABNORMAL HIGH (ref 0.44–1.00)
GFR, Estimated: 10 mL/min — ABNORMAL LOW (ref >60)
Glucose, Bld: 143 mg/dL — ABNORMAL HIGH (ref 70–99)
Potassium: 3.2 mmol/L — ABNORMAL LOW (ref 3.5–5.1)
Sodium: 134 mmol/L — ABNORMAL LOW (ref 135–145)

## 2023-10-26 LAB — URINALYSIS, ROUTINE W REFLEX MICROSCOPIC
Bilirubin Urine: NEGATIVE
Glucose, UA: NEGATIVE mg/dL
Ketones, ur: NEGATIVE mg/dL
Leukocytes,Ua: NEGATIVE
Nitrite: NEGATIVE
Protein, ur: 100 mg/dL — AB
Specific Gravity, Urine: 1.005 (ref 1.005–1.030)
pH: 9 — ABNORMAL HIGH (ref 5.0–8.0)

## 2023-10-26 LAB — RESP PANEL BY RT-PCR (RSV, FLU A&B, COVID)  RVPGX2
Influenza A by PCR: NEGATIVE
Influenza B by PCR: NEGATIVE
Resp Syncytial Virus by PCR: NEGATIVE
SARS Coronavirus 2 by RT PCR: NEGATIVE

## 2023-10-26 LAB — TSH: TSH: 0.988 u[IU]/mL (ref 0.350–4.500)

## 2023-10-26 LAB — BRAIN NATRIURETIC PEPTIDE: B Natriuretic Peptide: 103.3 pg/mL — ABNORMAL HIGH (ref 0.0–100.0)

## 2023-10-26 LAB — TROPONIN I (HIGH SENSITIVITY): Troponin I (High Sensitivity): 16 ng/L (ref <18)

## 2023-10-26 NOTE — Discharge Instructions (Signed)
 Your workup today is reassuring.  Follow-up with your primary care provider and kidney doctor.  Return to the ER for new, worsening, or persistent severe weakness, fever, shortness of breath, chest pain, vomiting, or any other new or worsening symptoms that concern you.

## 2023-10-26 NOTE — ED Triage Notes (Signed)
 Pt comes with c/o not feeling well. Pt states she think iron might be low. Pt was seen here on Friday and later dc. Pt did have dialysis yesterday. Pt states she is cold all the time. Pt states she is tired.

## 2023-10-26 NOTE — ED Provider Notes (Signed)
 [facility] Provider Note    Event Date/Time   First MD Initiated Contact with Patient 10/26/23 1611     (approximate)   History   Weakness   HPI  Shelby Galloway is a 78 y.o. female with a history of hypertension, anemia, diabetes, and ESRD on dialysis who presents with generalized weakness for approximately last week, worsening over the last several days.  The patient denies feeling dizzy or lightheaded.  She does report shortness of breath with minimal exertion which is new for her.  She denies any cough or fever.  She has no chest pain.  She denies any vomiting or diarrhea.  She denies any acute urinary symptoms.  She states that she still does make some urine.  She was seen in the ED a few days ago and had a negative workup at that time.  She had her last dialysis yesterday which was uneventful.  I reviewed the past medical records.  I confirmed that the patient was seen in the ED on 4/4 reporting generalized weakness.  Lab workup was significant for borderline hypokalemia.  The patient was given a fluid bolus and was able to go home.  Previously her most recent outpatient encounter was with nephrology at [facility] on 4/1 for follow-up of her ESRD.   Physical Exam   Triage Vital Signs: ED Triage Vitals  Encounter Vitals Group     BP 10/26/23 1532 (!) 147/72     Systolic BP Percentile --      Diastolic BP Percentile --      Pulse Rate 10/26/23 1532 76     Resp 10/26/23 1532 18     Temp 10/26/23 1532 98 F (36.7 C)     Temp src --      SpO2 10/26/23 1532 99 %     Weight 10/26/23 1531 139 lb 15.9 oz (63.5 kg)     Height 10/26/23 1531 5\' 5"  (1.651 m)     Head Circumference --      Peak Flow --      Pain Score 10/26/23 1531 0     Pain Loc --      Pain Education --      Exclude from Growth Chart --     Most recent vital signs: Vitals:   10/26/23 1900 10/26/23 1930  BP: (!) 161/74 (!) 162/71  Pulse:    Resp: (!) 24 20  Temp:   98.1 F (36.7 C)  SpO2:  99%     General: Alert, comfortable appearing, no distress.  CV:  Good peripheral perfusion.  Resp:  Normal effort.  Lungs CTAB. Abd:  No distention.  Other:  Motor intact in all extremities.  EOMI.  PERRLA.   ED Results / Procedures / Treatments   Labs (all labs ordered are listed, but only abnormal results are displayed) Labs Reviewed  BASIC METABOLIC PANEL WITH GFR - Abnormal; Notable for the following components:      Result Value   Sodium 134 (*)    Potassium 3.2 (*)    Chloride 97 (*)    Glucose, Bld 143 (*)    Creatinine, Ser 4.26 (*)    Calcium 8.7 (*)    GFR, Estimated 10 (*)    All other components within normal limits  CBC - Abnormal; Notable for the following components:   RBC 3.52 (*)    Hemoglobin 10.2 (*)    HCT 30.6 (*)    All other components within  normal limits  URINALYSIS, ROUTINE W REFLEX MICROSCOPIC - Abnormal; Notable for the following components:   Color, Urine YELLOW (*)    APPearance HAZY (*)    pH 9.0 (*)    Hgb urine dipstick SMALL (*)    Protein, ur 100 (*)    Bacteria, UA MANY (*)    All other components within normal limits  BRAIN NATRIURETIC PEPTIDE - Abnormal; Notable for the following components:   B Natriuretic Peptide 103.3 (*)    All other components within normal limits  RESP PANEL BY RT-PCR (RSV, FLU A&B, COVID)  RVPGX2  TSH  CBG MONITORING, ED  TROPONIN I (HIGH SENSITIVITY)     EKG  ED ECG REPORT I, Dionne Bucy, the attending physician, personally viewed and interpreted this ECG.  Date: 10/26/2023 EKG Time: 1535 Rate: 71 Rhythm: normal sinus rhythm QRS Axis: normal Intervals: normal ST/T Wave abnormalities: Nonspecific ST abnormalities Narrative Interpretation: no evidence of acute ischemia    RADIOLOGY  Chest x-ray: I independently viewed and interpreted the images;   PROCEDURES:  Critical Care performed: No  Procedures   MEDICATIONS ORDERED IN ED: Medications - No  data to display   IMPRESSION / MDM / ASSESSMENT AND PLAN / ED COURSE  I reviewed the triage vital signs and the nursing notes.  78 year old female with PMH as noted above presents with generalized weakness as well as shortness of breath on minimal exertion occurring over approximately last week.  She was seen in the ED a few days ago and had basic labs which were unremarkable.  Differential diagnosis includes, but is not limited to, anemia, dehydration, electrolyte abnormality, other metabolic cause, UTI, influenza or other viral infection, fluid overload, less likely cardiac etiology.  CBC shows a stable hemoglobin.  BMP shows no significant electrolyte abnormalities.  Creatinine is baseline.  I have added on a respiratory panel, troponin, TSH, BNP, urinalysis, and chest x-ray for further evaluation.  Patient's presentation is most consistent with acute complicated illness / injury requiring diagnostic workup.  The patient is on the cardiac monitor to evaluate for evidence of arrhythmia and/or significant heart rate changes.   ----------------------------------------- 7:25 PM on 10/26/2023 -----------------------------------------  Workup is reassuring.  Respiratory panel is negative.  Troponin is negative.  TSH is within normal limits.  BMP is normal.  Urinalysis shows bacteria but no other findings to suggest UTI.  Chest x-ray is also clear.  On reassessment, the patient states that her only complaint right now is that she feels cold.  At this time based on the reassuring workup and normal vital signs, there is no indication for inpatient admission or further emergent workup.  The patient states that she feels well to go home.  She agrees to follow-up with her primary care provider.  I counseled her on the results of the workup.  I gave strict return precautions and she expresses understanding.   FINAL CLINICAL IMPRESSION(S) / ED DIAGNOSES   Final diagnoses:  Generalized weakness      Rx / DC Orders   ED Discharge Orders     None        Note:  This document was prepared using Dragon voice recognition software and may include unintentional dictation errors.   Dionne Bucy, MD 10/26/23 2235

## 2024-06-12 ENCOUNTER — Emergency Department
Admission: EM | Admit: 2024-06-12 | Discharge: 2024-06-12 | Disposition: A | Discharge status: Home / Self Care | Attending: Emergency Medicine | Admitting: Emergency Medicine

## 2024-06-12 ENCOUNTER — Encounter: Payer: Self-pay | Admitting: Emergency Medicine

## 2024-06-12 ENCOUNTER — Emergency Department

## 2024-06-12 ENCOUNTER — Other Ambulatory Visit: Payer: Self-pay

## 2024-06-12 DIAGNOSIS — R410 Disorientation, unspecified: Secondary | ICD-10-CM | POA: Insufficient documentation

## 2024-06-12 DIAGNOSIS — Z992 Dependence on renal dialysis: Secondary | ICD-10-CM | POA: Insufficient documentation

## 2024-06-12 DIAGNOSIS — N186 End stage renal disease: Secondary | ICD-10-CM | POA: Diagnosis not present

## 2024-06-12 DIAGNOSIS — R4182 Altered mental status, unspecified: Secondary | ICD-10-CM | POA: Diagnosis present

## 2024-06-12 LAB — CBC
HCT: 31.8 % — ABNORMAL LOW (ref 36.0–46.0)
Hemoglobin: 9.6 g/dL — ABNORMAL LOW (ref 12.0–15.0)
MCH: 23.4 pg — ABNORMAL LOW (ref 26.0–34.0)
MCHC: 30.2 g/dL (ref 30.0–36.0)
MCV: 77.4 fL — ABNORMAL LOW (ref 80.0–100.0)
Platelets: 270 K/uL (ref 150–400)
RBC: 4.11 MIL/uL (ref 3.87–5.11)
RDW: 15.2 % (ref 11.5–15.5)
WBC: 6.2 K/uL (ref 4.0–10.5)
nRBC: 0 % (ref 0.0–0.2)

## 2024-06-12 LAB — COMPREHENSIVE METABOLIC PANEL WITH GFR
ALT: <5 U/L (ref 0–44)
AST: 14 U/L — ABNORMAL LOW (ref 15–41)
Albumin: 4 g/dL (ref 3.5–5.0)
Alkaline Phosphatase: 66 U/L (ref 38–126)
Anion gap: 12 (ref 5–15)
BUN: 18 mg/dL (ref 8–23)
CO2: 28 mmol/L (ref 22–32)
Calcium: 9.4 mg/dL (ref 8.9–10.3)
Chloride: 100 mmol/L (ref 98–111)
Creatinine, Ser: 4.06 mg/dL — ABNORMAL HIGH (ref 0.44–1.00)
GFR, Estimated: 11 mL/min — ABNORMAL LOW (ref >60)
Glucose, Bld: 105 mg/dL — ABNORMAL HIGH (ref 70–99)
Potassium: 3.3 mmol/L — ABNORMAL LOW (ref 3.5–5.1)
Sodium: 140 mmol/L (ref 135–145)
Total Bilirubin: 0.3 mg/dL (ref 0.0–1.2)
Total Protein: 7.7 g/dL (ref 6.5–8.1)

## 2024-06-12 LAB — TROPONIN T, HIGH SENSITIVITY
Troponin T High Sensitivity: 59 ng/L — ABNORMAL HIGH (ref 0–19)
Troponin T High Sensitivity: 57 ng/L — ABNORMAL HIGH (ref 0–19)

## 2024-06-12 MED ORDER — ACETAMINOPHEN 325 MG PO TABS
650.0000 mg | ORAL_TABLET | Freq: Once | ORAL | Status: AC
  Administered 2024-06-12: 650 mg via ORAL
  Filled 2024-06-12: qty 2

## 2024-06-12 NOTE — ED Provider Notes (Signed)
 [facility] Provider Note   Event Date/Time   First MD Initiated Contact with Patient 06/12/24 0840     (approximate) History  No chief complaint on file.  HPI Shelby Galloway is a 78 y.o. female with a stated past medical history of end-stage renal disease on dialysis who presents via EMS from dialysis session after getting about half of her treatment today and reportedly having episodes of altered mental status.  Per EMS report, patient was repeating questions and not responding appropriately during dialysis and EMS were called.  Patient has possible dementia however has not been tested.  Patient's son is at bedside provides further history the patient has been leaving her keys in her door and doing other forgetful things that were concerning for dementia however she has not had neurocognitive testing for diagnosis.  Patient denies any other complaints at this time ROS: Patient currently denies any vision changes, tinnitus, difficulty speaking, facial droop, sore throat, chest pain, shortness of breath, abdominal pain, nausea/vomiting/diarrhea, dysuria, or weakness/numbness/paresthesias in any extremity   Physical Exam  Triage Vital Signs: ED Triage Vitals  Encounter Vitals Group     BP 06/12/24 0845 133/73     Girls Systolic BP Percentile --      Girls Diastolic BP Percentile --      Boys Systolic BP Percentile --      Boys Diastolic BP Percentile --      Pulse Rate 06/12/24 0845 66     Resp 06/12/24 0845 16     Temp 06/12/24 0845 98.2 F (36.8 C)     Temp Source 06/12/24 0845 Oral     SpO2 06/12/24 0845 100 %     Weight 06/12/24 0848 139 lb 15.9 oz (63.5 kg)     Height 06/12/24 0848 5' 6 (1.676 m)     Head Circumference --      Peak Flow --      Pain Score 06/12/24 0848 0     Pain Loc --      Pain Education --      Exclude from Growth Chart --    Most recent vital signs: Vitals:   06/12/24 1256 06/12/24 1300  BP:  129/64  Pulse:  63  Resp:   19  Temp: 98 F (36.7 C)   SpO2:  99%   General: Awake, oriented x4. CV:  Good peripheral perfusion. Resp:  Normal effort. Abd:  No distention. Other:  Elderly well-developed, well-nourished African-American female resting comfortably in no acute distress ED Results / Procedures / Treatments  Labs (all labs ordered are listed, but only abnormal results are displayed) Labs Reviewed  COMPREHENSIVE METABOLIC PANEL WITH GFR - Abnormal; Notable for the following components:      Result Value   Potassium 3.3 (*)    Glucose, Bld 105 (*)    Creatinine, Ser 4.06 (*)    AST 14 (*)    GFR, Estimated 11 (*)    All other components within normal limits  CBC - Abnormal; Notable for the following components:   Hemoglobin 9.6 (*)    HCT 31.8 (*)    MCV 77.4 (*)    MCH 23.4 (*)    All other components within normal limits  TROPONIN T, HIGH SENSITIVITY - Abnormal; Notable for the following components:   Troponin T High Sensitivity 59 (*)    All other components within normal limits  TROPONIN T, HIGH SENSITIVITY - Abnormal; Notable for the following components:  Troponin T High Sensitivity 57 (*)    All other components within normal limits  CBG MONITORING, ED   RADIOLOGY ED MD interpretation: CT of the head without contrast interpreted by me shows no evidence of acute abnormalities including no intracerebral hemorrhage, obvious masses, or significant edema One-view portable chest x-ray interpreted by me shows no evidence of acute abnormalities including no pneumonia, pneumothorax, or widened mediastinum - All radiology independently interpreted and agree with radiology assessment Official radiology report(s): CT Head Wo Contrast Result Date: 06/12/2024 CLINICAL DATA:  Mental status changes. EXAM: CT HEAD WITHOUT CONTRAST TECHNIQUE: Contiguous axial images were obtained from the base of the skull through the vertex without intravenous contrast. RADIATION DOSE REDUCTION: This exam was  performed according to the departmental dose-optimization program which includes automated exposure control, adjustment of the mA and/or kV according to patient size and/or use of iterative reconstruction technique. COMPARISON:  None Available. FINDINGS: Brain: There is no evidence for acute hemorrhage, hydrocephalus, mass lesion, or abnormal extra-axial fluid collection. No definite CT evidence for acute infarction. Diffuse loss of parenchymal volume is consistent with atrophy. Patchy low attenuation in the deep hemispheric and periventricular white matter is nonspecific, but likely reflects chronic microvascular ischemic demyelination. Vascular: No hyperdense vessel or unexpected calcification. Skull: No evidence for fracture. No worrisome lytic or sclerotic lesion. Sinuses/Orbits: The visualized paranasal sinuses and mastoid air cells are clear. Visualized portions of the globes and intraorbital fat are unremarkable. Other: None. IMPRESSION: 1. No acute intracranial abnormality. 2. Atrophy with chronic small vessel ischemic disease. Electronically Signed   By: Camellia Candle M.D.   On: 06/12/2024 09:53   DG Chest Port 1 View Result Date: 06/12/2024 EXAM: 1 VIEW(S) XRAY OF THE CHEST 06/12/2024 09:27:00 AM COMPARISON: 10/26/2023 CLINICAL HISTORY: presyncope FINDINGS: LUNGS AND PLEURA: No focal pulmonary opacity. No pleural effusion. No pneumothorax. HEART AND MEDIASTINUM: Atherosclerotic calcifications at the aortic arch. No acute abnormality of the cardiac and mediastinal silhouettes. BONES AND SOFT TISSUES: No acute osseous abnormality. IMPRESSION: 1. No acute cardiopulmonary process. Electronically signed by: Waddell Calk MD 06/12/2024 09:35 AM EST RP Workstation: [id number]   PROCEDURES: Critical Care performed: No Procedures MEDICATIONS ORDERED IN ED: Medications  acetaminophen  (TYLENOL ) tablet 650 mg (650 mg Oral Given 06/12/24 0924)   IMPRESSION / MDM / ASSESSMENT AND PLAN / ED COURSE  I  reviewed the triage vital signs and the nursing notes.                             The patient is on the cardiac monitor to evaluate for evidence of arrhythmia and/or significant heart rate changes. Patient's presentation is most consistent with acute presentation with potential threat to life or bodily function. Patient is a 78 year old female with the above-stated past medical history who presents to EMS after being found confused at dialysis session today. DDx: Upper respiratory infection, encephalopathy, delirium, dementia, CVA Plan: CBC, CMP, troponin, head CT, chest x-ray Patient does not make good urine for a UA  Given laboratory and radiologic evaluation, patient is at her baseline per family and shows no signs of significant abnormalities.  Patient does have elevated troponin that downtrended on repeat.  Patient's creatinine is elevated at baseline and has no evidence of hyper kalemia.  Patient has mild hypokalemia however will not replete given her history.  Patient and family bedside encouraged to follow-up with primary care physician for neurocognitive testing given patient's worsening cognitive status.  Family agrees  with plan and all questions were answered prior to discharge.  Patient given strict return precautions  Dispo: Discharge home with PCP follow-up   FINAL CLINICAL IMPRESSION(S) / ED DIAGNOSES   Final diagnoses:  Confusion   Rx / DC Orders   ED Discharge Orders     None      Note:  This document was prepared using Dragon voice recognition software and may include unintentional dictation errors.   Lizzett Nobile K, MD 06/13/24 [phone]

## 2024-06-12 NOTE — ED Triage Notes (Signed)
 Pt to ED via [facility] from dialysis. EMS reports they were called out due to pt having an episode of not acting right at dialysis. Pt got about 1 hour and 40 minutes of her dialysis treatment today. Pt reports that on Thursday she only got about 30 minutes of dialysis. 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 Pts BP when EMS arrived was 98 systolic. Pt is in NAD at this time.

## 2024-06-12 NOTE — Discharge Instructions (Addendum)
 Please talk to your physician about cognitive testing for possible dementia

## 2024-06-12 NOTE — ED Notes (Signed)
Fall bundle implemented.
